# Patient Record
Sex: Female | Born: 1960 | Race: Black or African American | Hispanic: No | Marital: Single | Smoking: Current every day smoker
Health system: Southern US, Community
[De-identification: ages and names within clinical notes are randomized; demographics above are authoritative.]

## PROBLEM LIST (undated history)

## (undated) DIAGNOSIS — G8929 Other chronic pain: Secondary | ICD-10-CM

## (undated) DIAGNOSIS — M549 Dorsalgia, unspecified: Secondary | ICD-10-CM

---

## 1999-11-25 ENCOUNTER — Emergency Department (HOSPITAL_COMMUNITY): Admission: EM | Admit: 1999-11-25 | Discharge: 1999-11-26 | Payer: Self-pay | Admitting: Emergency Medicine

## 2002-05-05 ENCOUNTER — Emergency Department (HOSPITAL_COMMUNITY): Admission: EM | Admit: 2002-05-05 | Discharge: 2002-05-05 | Payer: Self-pay | Admitting: Emergency Medicine

## 2002-05-12 ENCOUNTER — Emergency Department (HOSPITAL_COMMUNITY): Admission: EM | Admit: 2002-05-12 | Discharge: 2002-05-12 | Payer: Self-pay | Admitting: Emergency Medicine

## 2006-09-23 ENCOUNTER — Emergency Department (HOSPITAL_COMMUNITY): Admission: EM | Admit: 2006-09-23 | Discharge: 2006-09-23 | Payer: Self-pay | Admitting: Emergency Medicine

## 2007-01-26 ENCOUNTER — Emergency Department (HOSPITAL_COMMUNITY): Admission: EM | Admit: 2007-01-26 | Discharge: 2007-01-26 | Payer: Self-pay | Admitting: Emergency Medicine

## 2013-01-12 ENCOUNTER — Emergency Department (HOSPITAL_COMMUNITY)
Admission: EM | Admit: 2013-01-12 | Discharge: 2013-01-12 | Disposition: A | Discharge status: Home / Self Care | Payer: Self-pay | Attending: Emergency Medicine | Admitting: Emergency Medicine

## 2013-01-12 ENCOUNTER — Encounter (HOSPITAL_COMMUNITY): Payer: Self-pay | Admitting: Emergency Medicine

## 2013-01-12 DIAGNOSIS — G8929 Other chronic pain: Secondary | ICD-10-CM | POA: Insufficient documentation

## 2013-01-12 DIAGNOSIS — M25539 Pain in unspecified wrist: Secondary | ICD-10-CM | POA: Insufficient documentation

## 2013-01-12 DIAGNOSIS — F17200 Nicotine dependence, unspecified, uncomplicated: Secondary | ICD-10-CM | POA: Insufficient documentation

## 2013-01-12 MED ORDER — MELOXICAM 15 MG PO TABS: 15.0000 mg | ORAL_TABLET | Freq: Every day | ORAL | Status: DC

## 2013-01-12 NOTE — ED Notes (Signed)
C/O "tendonitis" right forearm. States has hx of same. 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

## 2013-01-12 NOTE — Progress Notes (Signed)
222Orthopedic Tech Progress Note Patient Details:  Tamara Barajas 1960/12/22 [id number]  Ortho Devices Type of Ortho Device: Velcro wrist splint Ortho Device/Splint Location: right wrist Ortho Device/Splint Interventions: Adjustment   Nikki Dom 01/12/2013, 1:56 PM

## 2013-01-12 NOTE — ED Provider Notes (Signed)
History     CSN: [id number]  Arrival date & time 01/12/13  1252   First MD Initiated Contact with Patient 01/12/13 1311      Chief Complaint  Patient presents with  . Arm Pain    (Consider location/radiation/quality/duration/timing/severity/associated sxs/prior treatment) Patient is a 52 y.o. female presenting with arm pain.  Arm Pain This is a recurrent problem. The current episode started 1 to 4 weeks ago. The problem occurs constantly. The problem has been gradually worsening. Pertinent negatives include no abdominal pain, anorexia, arthralgias, change in bowel habit, chest pain, chills, congestion, coughing, diaphoresis, fatigue, fever, headaches, joint swelling, myalgias, nausea, neck pain, numbness, rash, sore throat, swollen glands, urinary symptoms, vertigo, visual change, vomiting or weakness. Exacerbated by: twisting the wrist. She has tried NSAIDs for the symptoms. The treatment provided no relief.   52 year old female presents with right forearm pain.  Patient has a history of the same pain in the left arm with a diagnosis of tendinitis.  The patient cleans houses for a living.  She states that she has pain in the right forearm with circular movements like window washing.  She's tried NSAIDs without relief .  Heat swelling or redness.  No known mechanism of injury.  The patient denies any fevers chills, nausea, vomiting, or paresthesia.   History reviewed. No pertinent past medical history.  History reviewed. No pertinent past surgical history.  No family history on file.  History  Substance Use Topics  . Smoking status: Current Every Day Smoker    Types: Cigarettes  . Smokeless tobacco: Not on file  . Alcohol Use: Yes     Comment: occasionally    OB History   Grav Para Term Preterm Abortions TAB SAB Ect Mult Living                  Review of Systems  Constitutional: Negative for fever, chills, diaphoresis and fatigue.  HENT: Negative for congestion, sore  throat and neck pain.   Respiratory: Negative for cough.   Cardiovascular: Negative for chest pain.  Gastrointestinal: Negative for nausea, vomiting, abdominal pain, anorexia and change in bowel habit.  Musculoskeletal: Negative for myalgias, joint swelling and arthralgias.  Skin: Negative for rash.  Neurological: Negative for vertigo, weakness, numbness and headaches.    Allergies  Review of patient's allergies indicates no known allergies.  Home Medications   Current Outpatient Rx  Name  Route  Sig  Dispense  Refill  . ibuprofen (ADVIL,MOTRIN) 200 MG tablet   Oral   Take 400 mg by mouth every 6 (six) hours as needed for pain.           BP 144/89  Pulse 75  Temp(Src) 97.4 F (36.3 C) (Oral)  SpO2 98%  LMP 10/14/2012  Physical Exam  Constitutional: She is oriented to person, place, and time. She appears well-developed and well-nourished. No distress.  HENT:  Head: Normocephalic and atraumatic.  Eyes: Conjunctivae are normal. No scleral icterus.  Neck: Normal range of motion.  Cardiovascular: Normal rate, regular rhythm and normal heart sounds.  Exam reveals no gallop and no friction rub.   No murmur heard. Pulmonary/Chest: Effort normal and breath sounds normal. No respiratory distress.  Abdominal: Soft. Bowel sounds are normal. She exhibits no distension and no mass. There is no tenderness. There is no guarding.  Musculoskeletal:  A right wrist exam was performed. SKIN: normal SWELLING: none WARMTH: no warmth TENDERNESS:  mild ROM: Full- no crepitus STRENGTH: normal NEUROVASCULAR EXAM: normal  Neurological: She is alert and oriented to person, place, and time.  Skin: Skin is warm and dry. She is not diaphoretic.    ED Course  Procedures (including critical care time)  Labs Reviewed - No data to display No results found.   1. Wrist pain, chronic, right       MDM  1:41 PM BP 144/89  Pulse 75  Temp(Src) 97.4 F (36.3 C) (Oral)  SpO2 98%  LMP  10/14/2012 Patient with probable tendinitis.  I do not suspect tenosynovitis.  No signs of developing infection such as heat redness or swelling.  Discharged with a wrist splint and anti-inflammatory.  Supportive care/return precautions  discussed.         Arthor Captain, PA-C 01/15/13 [phone]

## 2013-01-15 NOTE — ED Provider Notes (Signed)
Medical screening examination/treatment/procedure(s) were performed by non-physician practitioner and as supervising physician I was immediately available for consultation/collaboration.  Glynn Octave, MD 01/15/13 [phone]

## 2014-08-16 ENCOUNTER — Emergency Department (HOSPITAL_COMMUNITY)
Admission: EM | Admit: 2014-08-16 | Discharge: 2014-08-16 | Disposition: A | Discharge status: Home / Self Care | Payer: Self-pay | Attending: Emergency Medicine | Admitting: Emergency Medicine

## 2014-08-16 ENCOUNTER — Encounter (HOSPITAL_COMMUNITY): Payer: Self-pay | Admitting: Emergency Medicine

## 2014-08-16 DIAGNOSIS — M549 Dorsalgia, unspecified: Secondary | ICD-10-CM

## 2014-08-16 DIAGNOSIS — S39012A Strain of muscle, fascia and tendon of lower back, initial encounter: Secondary | ICD-10-CM | POA: Insufficient documentation

## 2014-08-16 DIAGNOSIS — Y9259 Other trade areas as the place of occurrence of the external cause: Secondary | ICD-10-CM | POA: Insufficient documentation

## 2014-08-16 DIAGNOSIS — Z720 Tobacco use: Secondary | ICD-10-CM | POA: Insufficient documentation

## 2014-08-16 DIAGNOSIS — X58XXXA Exposure to other specified factors, initial encounter: Secondary | ICD-10-CM | POA: Insufficient documentation

## 2014-08-16 DIAGNOSIS — S29012A Strain of muscle and tendon of back wall of thorax, initial encounter: Secondary | ICD-10-CM

## 2014-08-16 DIAGNOSIS — Y93E9 Activity, other interior property and clothing maintenance: Secondary | ICD-10-CM | POA: Insufficient documentation

## 2014-08-16 DIAGNOSIS — X503XXA Overexertion from repetitive movements, initial encounter: Secondary | ICD-10-CM

## 2014-08-16 HISTORY — DX: Other chronic pain: G89.29

## 2014-08-16 HISTORY — DX: Dorsalgia, unspecified: M54.9

## 2014-08-16 MED ORDER — HYDROCODONE-ACETAMINOPHEN 5-325 MG PO TABS
2.0000 | ORAL_TABLET | Freq: Once | ORAL | Status: AC
  Administered 2014-08-16: 2 via ORAL
  Filled 2014-08-16: qty 2

## 2014-08-16 MED ORDER — CYCLOBENZAPRINE HCL 10 MG PO TABS: 10.0000 mg | ORAL_TABLET | Freq: Two times a day (BID) | ORAL | Status: DC | PRN

## 2014-08-16 MED ORDER — IBUPROFEN 600 MG PO TABS: 600.0000 mg | ORAL_TABLET | Freq: Four times a day (QID) | ORAL | Status: DC | PRN

## 2014-08-16 NOTE — ED Provider Notes (Signed)
CSN: [id number]     Arrival date & time 08/16/14  1437 History   None    Chief Complaint  Patient presents with  . Back Pain     (Consider location/radiation/quality/duration/timing/severity/associated sxs/prior Treatment) HPI [name] is a 53 year old female with past medical history of chronic back pain who presents the ER with one day of back pain. 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 Patient states the pain is a stabbing, cramping sensation, and she states is consistent with a back pain she's had in the past. Patient states moving worsens her pain, and rest alleviates her pain. Patient states in the past she's used Aleve and ibuprofen to help her back pain which have both been effective. Patient denies any numbness, weakness, saddle anesthesia, nausea, vomiting, fever, history of cancer or IV drug use.  Past Medical History  Diagnosis Date  . Chronic back pain    History reviewed. No pertinent past surgical history. History reviewed. No pertinent family history. History  Substance Use Topics  . Smoking status: Current Every Day Smoker    Types: Cigarettes  . Smokeless tobacco: Not on file  . Alcohol Use: Yes     Comment: occasionally   OB History   Grav Para Term Preterm Abortions TAB SAB Ect Mult Living                 Review of Systems  Constitutional: Negative for fever.  Gastrointestinal: Negative for nausea and vomiting.  Musculoskeletal: Positive for arthralgias and back pain.  Neurological: Negative for weakness and numbness.      Allergies  Review of patient's allergies indicates no known allergies.  Home Medications   Prior to Admission medications   Medication Sig Start Date End Date Taking? Authorizing Provider  cyclobenzaprine (FLEXERIL) 10 MG tablet Take 1 tablet (10 mg total) by mouth 2 (two)  times daily as needed for muscle spasms. 08/16/14   Monte FantasiaJoseph W Keltin Baird, PA-C  ibuprofen (ADVIL,MOTRIN) 600 MG tablet Take 1 tablet (600 mg total) by mouth every 6 (six) hours as needed. 08/16/14   Monte FantasiaJoseph W Stephfon Bovey, PA-C   BP 123/68  Pulse 64  Temp(Src) 98.8 F (37.1 C) (Oral)  Resp 18  SpO2 100% Physical Exam  Nursing note and vitals reviewed. Constitutional: She appears well-developed and well-nourished. No distress.  HENT:  Head: Normocephalic and atraumatic.  Eyes: Conjunctivae are normal. Right eye exhibits no discharge. Left eye exhibits no discharge. No scleral icterus.  Cardiovascular:  Peripheral pulses intact at injured extremity.   Pulmonary/Chest: Effort normal. No respiratory distress.  Musculoskeletal:       Back:  Full active range of motion of back. Tenderness noted from superior aspect of left scapula all the way down to left lateral lumbar region.  Neurological: She is alert. She has normal strength. No cranial nerve deficit or sensory deficit. She displays a negative Romberg sign. Coordination and gait normal. GCS eye subscore is 4. GCS verbal subscore is 5. GCS motor subscore is 6.  Patient fully alert answering questions appropriate full, clear sentences. Motor strength 5 out of 5 in all major muscle groups of upper and lower extremities. Negative straight leg raise bilaterally. Distal sensation intact in extremities.  Skin: Skin is warm and dry. No rash noted. She is not diaphoretic.    ED Course  Procedures (including critical care time) Labs Review Labs Reviewed - No data to display  Imaging Review No results found.   EKG Interpretation None      MDM   Final diagnoses:  Acute back pain  Upper back strain, initial encounter  Repetitive strain injury of lower back, initial encounter    Patient with back pain.  No neurological deficits and normal neuro exam.  Patient can walk but states is painful.  No loss of bowel or bladder control.  No concern for  cauda equina.  No fever, night sweats, weight loss, h/o cancer, IVDU.  RICE protocol and pain medicine indicated and discussed with patient.   BP 123/68  Pulse 64  Temp(Src) 98.8 F (37.1 C) (Oral)  Resp 18  SpO2 100%  Signed,  Ladona MowJoe Briza Bark, PA-C 3:55 PM     Monte FantasiaJoseph W Cayton Cuevas, PA-C 08/17/14 [phone]

## 2014-08-16 NOTE — Progress Notes (Signed)
  CARE MANAGEMENT ED NOTE 08/16/2014  Patient:  Tamara Barajas,Tamara Barajas   Account Number:  [id number]  Date Initiated:  08/16/2014  Documentation initiated by:  Radford PaxFERRERO,Jashua Knaak  Subjective/Objective Assessment:   Patient presents to Ed with back pain     Subjective/Objective Assessment Detail:     Action/Plan:   Action/Plan Detail:   Anticipated DC Date:  08/16/2014     Status Recommendation to Physician:   Result of Recommendation:      DC Planning Services  Other  PCP issues    Choice offered to / List presented to:            Status of service:  Completed, signed off  ED Comments:   ED Comments Detail:  EDCM spoke to patient at bedside. Patient confirms she does not have a pcp or insurance [place]. EDCM [facility], informed patient of services there and walk in times.  EDCM also provided patient with list of pcps who accept self pay patients, list of discount pharmacies and websites needymeds.org and GoodRX.com for medication assistance, phone number to inquire about the orange card, phone number to inquire about Medic[employer], phone number to inquire about the Affordable Care Act, financial resources in the community such as local churches, [employer], urban ministries, and dental assistance for uninsured patients. Patient thankfulf or resources.  No further EDCM needs at this time. [facility] referral made.

## 2014-08-16 NOTE — Discharge Instructions (Signed)
Back Pain, Adult °Low back pain is very common. About 1 in 5 people have back pain. The cause of low back pain is rarely dangerous. The pain often gets better over time. About half of people with a sudden onset of back pain feel better in just 2 weeks. About 8 in 10 people feel better by 6 weeks.  °CAUSES °Some common causes of back pain include: °· Strain of the muscles or ligaments supporting the spine. °· Wear and tear (degeneration) of the spinal discs. °· Arthritis. °· Direct injury to the back. °DIAGNOSIS °Most of the time, the direct cause of low back pain is not known. However, back pain can be treated effectively even when the exact cause of the pain is unknown. Answering your caregiver's questions about your overall health and symptoms is one of the most accurate ways to make sure the cause of your pain is not dangerous. If your caregiver needs more information, he or she may order lab work or imaging tests (X-rays or MRIs). However, even if imaging tests show changes in your back, this usually does not require surgery. °HOME CARE INSTRUCTIONS °For many people, back pain returns. Since low back pain is rarely dangerous, it is often a condition that people can learn to manage on their own.  °· Remain active. It is stressful on the back to sit or stand in one place. Do not sit, drive, or stand in one place for more than 30 minutes at a time. Take short walks on level surfaces as soon as pain allows. Try to increase the length of time you walk each day. °· Do not stay in bed. Resting more than 1 or 2 days can delay your recovery. °· Do not avoid exercise or work. Your body is made to move. It is not dangerous to be active, even though your back may hurt. Your back will likely heal faster if you return to being active before your pain is gone. °· Pay attention to your body when you  bend and lift. Many people have less discomfort when lifting if they bend their knees, keep the load close to their bodies, and  avoid twisting. Often, the most comfortable positions are those that put less stress on your recovering back. °· Find a comfortable position to sleep. Use a firm mattress and lie on your side with your knees slightly bent. If you lie on your back, put a pillow under your knees. °· Only take over-the-counter or prescription medicines as directed by your caregiver. Over-the-counter medicines to reduce pain and inflammation are often the most helpful. Your caregiver may prescribe muscle relaxant drugs. These medicines help dull your pain so you can more quickly return to your normal activities and healthy exercise. °· Put ice on the injured area. °¨ Put ice in a plastic bag. °¨ Place a towel between your skin and the bag. °¨ Leave the ice on for 15-20 minutes, 03-04 times a day for the first 2 to 3 days. After that, ice and heat may be alternated to reduce pain and spasms. °· Ask your caregiver about trying back exercises and gentle massage. This may be of some benefit. °· Avoid feeling anxious or stressed. Stress increases muscle tension and can worsen back pain. It is important to recognize when you are anxious or stressed and learn ways to manage it. Exercise is a great option. °SEEK MEDICAL CARE IF: °· You have pain that is not relieved with rest or medicine. °· You have pain that does not improve in 1 week. °· You have new symptoms. °· You are generally not feeling well. °SEEK   IMMEDIATE MEDICAL CARE IF:  °· You have pain that radiates from your back into your legs. °· You develop new bowel or bladder control problems. °· You have unusual weakness or numbness in your arms or legs. °· You develop nausea or vomiting. °· You develop abdominal pain. °· You feel faint. °Document Released: 10/13/2005 Document Revised: 04/13/2012 Document Reviewed: 02/14/2014 °ExitCare® Patient Information ©2015 [employer]. This information is not intended to replace advice given to you by your health care provider. Make sure you  discuss any questions you have with your health care provider. ° ° °Back Exercises °Back exercises help treat and prevent back injuries. The goal of back exercises is to increase the strength of your abdominal and back muscles and the flexibility of your back. These exercises should be started when you no longer have back pain. Back exercises include: °· Pelvic Tilt. Lie on your back with your knees bent. Tilt your pelvis until the lower part of your back is against the floor. Hold this position 5 to 10 sec and repeat 5 to 10 times. °· Knee to Chest. Pull first 1 knee up against your chest and hold for 20 to 30 seconds, repeat this with the other knee, and then both knees. This may be done with the other leg straight or bent, whichever feels better. °· Sit-Ups or Curl-Ups. Bend your knees 90 degrees. Start with tilting your pelvis, and do a partial, slow sit-up, lifting your trunk only 30 to 45 degrees off the floor. Take at least 2 to 3 seconds for each sit-up. Do not do sit-ups with your knees out straight. If partial sit-ups are difficult, simply do the above but with only tightening your abdominal muscles and holding it as directed. °· Hip-Lift. Lie on your back with your knees flexed 90 degrees. Push down with your feet and shoulders as you raise your hips a couple inches off the floor; hold for 10 seconds, repeat 5 to 10 times. °· Back arches. Lie on your stomach, propping yourself up on bent elbows. Slowly press on your hands, causing an arch in your low back. Repeat 3 to 5 times. Any initial stiffness and discomfort should lessen with repetition over time. °· Shoulder-Lifts. Lie face down with arms beside your body. Keep hips and torso pressed to floor as you slowly lift your head and shoulders off the floor. °Do not overdo your exercises, especially in the beginning. Exercises may cause you some mild back discomfort which lasts for a few minutes; however, if the pain is more severe, or lasts for more than  15 minutes, do not continue exercises until you see your caregiver. Improvement with exercise therapy for back problems is slow.  °See your caregivers for assistance with developing a proper back exercise program. °Document Released: 11/20/2004 Document Revised: 01/05/2012 Document Reviewed: 08/14/2011 °ExitCare® Patient Information ©2015 [employer]. This information is not intended to replace advice given to you by your health care provider. Make sure you discuss any questions you have with your health care provider. ° ° °Emergency Department Resource Guide °1) Find a Doctor and Pay Out of Pocket °Although you won't have to find out who is covered by your insurance plan, it is a good idea to ask around and get recommendations. You will then need to call the office and see if the doctor you have chosen will accept you as a new patient and what types of options they offer for patients who are self-pay. Some doctors offer discounts or   will set up payment plans for their patients who do not have insurance, but you will need to ask so you aren't surprised when you get to your appointment. ° °2) Contact Your Local Health Department °Not all health departments have doctors that can see patients for sick visits, but many do, so it is worth a call to see if yours does. If you don't know where your local health department is, you can check in your phone book. The CDC also has a tool to help you locate your state's health department, and many state websites also have listings of all of their local health departments. ° °3) Find a Walk-in Clinic °If your illness is not likely to be very severe or complicated, you may want to try a walk in clinic. These are popping up all over the country in pharmacies, drugstores, and shopping centers. They're usually staffed by nurse practitioners or physician assistants that have been trained to treat common illnesses and complaints. They're usually fairly quick and inexpensive. However,  if you have serious medical issues or chronic medical problems, these are probably not your best option. ° °No Primary Care Doctor: °- Call [facility] at  832-8000 - they can help you locate a primary care doctor that  accepts your insurance, provides certain services, etc. °- Physician Referral Service- 1-800-533-3463 ° °Chronic Pain Problems: °Organization         Address  Phone   Notes  °[facility]  (336) 297-2271 Patients need to be referred by their primary care doctor.  ° °Medication Assistance: °Organization         Address  Phone   Notes  °[place] [facility] [address] °[place], [place] [address] (336) 641-8030 --Must be a resident of [place] °-- Must have NO insurance coverage whatsoever (no Medicaid/ Medicare, etc.) °-- The pt. MUST have a primary care doctor that directs their care regularly and follows them in the community °  °[facility]  (866) 331-1348   °[employer]  (888) 892-1162   ° °Agencies that provide inexpensive medical care: °Organization         Address  Phone   Notes  °[facility]  (336) 832-8035   °[facility]    (336) 832-7272   °[facility] [address] °[place], [place] [address] (336) 832-4777   °[facility] [address], °[place] (336) 271-4999   °[facility]    (336) 373-0678   °[facility]    (336) 272-1050   °[facility] ° [address], [place] Phone:  (336) 832-4444, Fax:  (336) 832-4440 Hours of Operation:  9 am - 6 pm, M-F.  Also accepts Medicaid/Medicare and self-pay.  °[facility] ° [address],  Phone: (336) 832-3150, Fax: (336) 832-3151. Hours of Operation:  8:30 am - 5:30 pm, M-F.  Also accepts Medicaid and self-pay.  °[facility] [place] [address], [place] Phone: (336) 878-6027   °[facility] [address], [place], [place] (336)723-1848, Ext. 123 Mondays & Thursdays: 7-9 AM.  First 15 patients are seen on a first come, first serve basis. °  ° °Medicaid-accepting [place] Providers: ° °Organization         Address  Phone   Notes  °[facility] [address],  (336)   641-2100 Also accepts self-pay patients.  °[facility] [address], [place] ° (336) 856-9996   °[facility] [address], [place] (336) 288-8857   °[facility] [address] [place] [address], [place] (336) 299-7000   °Veita Bland [address], [place]  ° (336) 373-1557 Only accepts  Access Medicaid patients after they have their name applied to their card.  ° °Self-Pay (no insurance) in [place]: ° °Organization         Address  Phone   Notes  °Sickle Cell Patients, [facility] [address], [place] (336) 832-1970   °[facility] [address], [place] (336) 832-4400   ° [facility] ° [address], [place] (336) 992-4800   °[facility]/Dr. Osei-Bonsu ° [address] [place] [address], [place] or [address], [place] (336) [phone] Phone number for both [place] and [place] locations is the same.  °[facility] [address], [place] (336) 299-0000   °[facility] [address] [place] [address], [place] or [address] (336) 852-7530 °(336) 878-2260   °[facility] [address], [place] (336) 350-1642, phone; (336) 294-5005, fax Sees patients 1st and 3rd Saturday of every month.  Must not qualify for public or private insurance (i.e. Medicaid, Medicare, [place] Health Choice, Veterans' Benefits) • Household income should be no more than 200% of the poverty level •The clinic cannot treat you if you are pregnant or think you are  pregnant • Sexually transmitted diseases are not treated at the clinic.  ° ° °Dental Care: °Organization         Address  Phone  Notes  °[facility] [address], [place] (336) 641-6152 Accepts children up to age 21 who are enrolled in Medicaid or [place] Health Choice; pregnant women with a Medicaid card; and children who have applied for Medicaid or [place] Health Choice, but were declined, whose parents can pay a reduced fee at time of service.  °[facility] [place]  [address], [place] (336) 641-7733 Accepts children up to age 21 who are enrolled in Medicaid or [place] Health Choice; pregnant women with a Medicaid card; and children who have applied for Medicaid or  Health Choice, but were declined, whose parents can pay a reduced fee at time of service.  °[facility] ° [address], [place] (336) 641-4533 Patients are seen by appointment only. Walk-ins are not accepted. [facility] will see patients 18 years of age and older. °Monday - Tuesday (8am-5pm) °Most Wednesdays (8:30-5pm) °$30 per visit, cash only  °[facility] ° [address], [place] (336) 641-4533 Patients are seen by appointment only. Walk-ins are not accepted. [facility] will see patients 18 years of age and older. °One Wednesday Evening (Monthly: Volunteer Based).  $30 per visit, cash only  °[facility]  (919) 537-3737 for adults; Children under age 4, call Graduate Pediatric Dentistry at (919) [phone]. Children aged 4-14, please call (919) [phone] to request a pediatric application. ° Dental services are provided in all areas of dental care including fillings, crowns and bridges, complete and partial dentures, implants, gum treatment, root canals, and extractions. Preventive care is also provided. Treatment   is provided to both adults and  children. °Patients are selected via a lottery and there is often a waiting list. °  °[facility] [address], °[place] ° (336) 763-8833 [email or web address] °  °[facility] [address], [place], [place] (336)723-1848, Ext. 123 Second and Fourth Thursday of each month, opens at 6:30 AM; Clinic ends at 9 AM.  Patients are seen on a first-come first-served basis, and a limited number are seen during each clinic.  ° °[facility] ° [address], [place], [place] (336) 723-7904   Eligibility Requirements °You must have lived in [place], [place], or [place] counties for at least the last three months. °  You cannot be eligible for state or federal sponsored healthcare insurance, including Veterans Administration, Medicaid, or Medicare. °  You generally cannot be eligible for healthcare insurance through your employer.  °  How to apply: °Eligibility screenings are held every Tuesday and Wednesday afternoon from 1:00 pm until 4:00 pm. You do not need an appointment for the interview!  °[facility] [address], [place], [place] 336-631-2330   °[facility] [facility]  336-342-8273   °[facility]  336-703-3100   °[facility]  336-570-6415   ° °Behavioral Health Resources in the Community: °Intensive Outpatient Programs °Organization         Address  Phone  Notes  °[facility] [address], [place], [place] 336-878-6098   °[facility] [address], [place], [place] 336-832-9800   °[facility] [address], [place], [place] ° 336-882-2125   °[facility] [address],  °[place], [place] 1-800-853-5163 or 336-641-4981   °Substance Abuse Resources °Organization         Address  Phone  Notes  °[facility]  336-882-2125   °[facility]  336-784-9470   °[facility]  336-285-9073    °[facility]  336-845-3988   °Residential & Outpatient Substance Abuse Program  1-800-659-3381   °Psychological Services °Organization         Address  Phone  Notes  °[facility]  336- 832-9600   °[facility]  336- 378-7881   °[facility] [address], [place] 1-800-853-5163 or 336-641-4981   ° °Mobile Crisis Teams °Organization         Address  Phone  Notes  °[facility], Mobile Crisis Care Unit  1-877-626-1772   °Assertive °[facility] ° [address] [place], [place] 336-834-9664   °Sharon DeEsch [address] °[place] 336-554-5454   ° °Self-Help/Support Groups °Organization         Address  Phone             Notes  °[facility] - variety of support groups  336- 373-1402 Call for more information  °Narcotics Anonymous (NA), [facility] [address], °[place]  2 meetings at this location  ° °Residential Treatment Programs °Organization         Address  Phone  Notes  °[facility] [address],    °[place]  1-866-801-8205   °[facility] ° [address], [place], [place] 704-293-8524   °[facility] [address], [place] 336-845-3988 Admissions: 8am-3pm M-F  °[facility] [address]    °[place],   [place] 336-841-1104   °[facility] [address], [place], [place] 336-379-7146   °[facility] [address]  °[place], [place] 336-285-9073   °[facility] - Intensive Outpatient [address], [place], [place] 336-852-3033   °[facility] [address]  °[place], [place] 1-877-615-2722 or 336-784-9470   °[facility] [address] [place], [place] 336-227-7417 Accepts Medicaid  °[facility] [address]  ° [place] 1-800-659-3381 Substance Abuse/Addiction Treatment  ° °[place]  Behavioral Health Resources °Organization         Address  Phone  Notes  °[facility]  (888) 581-9988   °Julie Brannon, PhD [address] [place], [place]   (336) 349-5553 or (336) 951-0000   °[facility]   [address] °[place], [place] (336) 349-4454   °[facility] [address], [place], [place] (336) 342-8316 Insurance/Medicaid/sponsorship through [facility]  °[facility] [address]                                    [place], [place] (336) 342-8316 Therapy/tele-psych/case  °[facility] [address]  ° [place], [place] (336) 349-2233    °Dr. Arfeen  (336) 349-4544   °[facility] [place]  [employer] [place] [facility] 1) [address], [place] °2) [address], [place] °3)  [address], [place] (336) 349-3220 °(336) 342-7768 ° °(336) 342-8140   °[place] [facility] (336) 342-1394 or (336) 342-3537 (After Hours)    ° ° ° °

## 2014-08-16 NOTE — ED Notes (Signed)
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  Was making a bed when she had sudden, shooting back pain.  Eased herself to floor.

## 2014-08-19 NOTE — ED Provider Notes (Signed)
Medical screening examination/treatment/procedure(s) were performed by non-physician practitioner and as supervising physician I was immediately available for consultation/collaboration.   EKG Interpretation None       Seleena Reimers R. Alanmichael Barmore, MD 08/19/14 0906 

## 2014-08-21 NOTE — ED Notes (Signed)
Pt's chart accessed after pt called asking for a work note. 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 Explained to pt that we can not print work note saying that because that was not in her discharge instructions. Pt voiced understanding.

## 2014-08-25 ENCOUNTER — Ambulatory Visit: Payer: Self-pay | Admitting: Internal Medicine

## 2014-09-13 ENCOUNTER — Ambulatory Visit: Payer: Self-pay

## 2015-07-09 ENCOUNTER — Emergency Department (HOSPITAL_COMMUNITY)
Admission: EM | Admit: 2015-07-09 | Discharge: 2015-07-09 | Disposition: A | Discharge status: Home / Self Care | Payer: No Typology Code available for payment source | Attending: Physician Assistant | Admitting: Physician Assistant

## 2015-07-09 ENCOUNTER — Encounter (HOSPITAL_COMMUNITY): Payer: Self-pay | Admitting: Emergency Medicine

## 2015-07-09 ENCOUNTER — Emergency Department (HOSPITAL_COMMUNITY): Payer: No Typology Code available for payment source

## 2015-07-09 DIAGNOSIS — M1811 Unilateral primary osteoarthritis of first carpometacarpal joint, right hand: Secondary | ICD-10-CM | POA: Insufficient documentation

## 2015-07-09 DIAGNOSIS — M79644 Pain in right finger(s): Secondary | ICD-10-CM | POA: Insufficient documentation

## 2015-07-09 DIAGNOSIS — Z720 Tobacco use: Secondary | ICD-10-CM | POA: Insufficient documentation

## 2015-07-09 DIAGNOSIS — G8929 Other chronic pain: Secondary | ICD-10-CM | POA: Diagnosis not present

## 2015-07-09 MED ORDER — HYDROCODONE-ACETAMINOPHEN 5-325 MG PO TABS
1.0000 | ORAL_TABLET | Freq: Once | ORAL | Status: AC
  Administered 2015-07-09: 1 via ORAL
  Filled 2015-07-09: qty 1

## 2015-07-09 MED ORDER — HYDROCODONE-ACETAMINOPHEN 5-325 MG PO TABS: 1.0000 | ORAL_TABLET | Freq: Four times a day (QID) | ORAL | Status: DC | PRN

## 2015-07-09 MED ORDER — NAPROXEN 500 MG PO TABS: 500.0000 mg | ORAL_TABLET | Freq: Two times a day (BID) | ORAL | Status: DC | PRN

## 2015-07-09 NOTE — ED Provider Notes (Signed)
CSN: [id number]     Arrival date & time 07/09/15  1247 History  This chart was scribed for non-physician practitioner Allen Derry, PA-C working with Abelino Derrick, MD by Lyndel Safe, ED Scribe. This patient was seen in room TR07C/TR07C and the patient's care was started at 1:43 PM.   Chief Complaint  Patient presents with  . Finger Injury    right thumb   Patient is a 54 y.o. female presenting with hand pain. The history is provided by the patient. No language interpreter was used.  Hand Pain This is a new problem. The current episode started more than 2 days ago. The problem occurs constantly. The problem has not changed since onset.Pertinent negatives include no chest pain, no abdominal pain and no shortness of breath. The symptoms are aggravated by bending (thumb bending). The symptoms are relieved by NSAIDs. Treatments tried: NSAIDs. The treatment provided mild relief.   HPI Comments: Tamara Barajas is a 54 y.o. female with a PMHx of chronic back pain, who presents to the Emergency Department complaining of constant, 8/10, throbbing/aching, non-radiating right thumb pain onset 1 week, exacerbated with movement of thumb, and mildly relieved by Motrin. Associated swelling to PIP joint. Pt is a housekeeper, right-handed, but denies know injury or overuse activities. Pt denies trauma/fall/injury to area, redness/warmth to affected area, fevers, chills, nausea, vomiting, diarrhea, constipation, abdominal pain, CP, SOB, myalgias, hematuria, dysuria, numbness, tingling or weakness. NKDA. No hx of gout.   Past Medical History  Diagnosis Date  . Chronic back pain    No past surgical history on file. No family history on file. Social History  Substance Use Topics  . Smoking status: Current Every Day Smoker    Types: Cigarettes  . Smokeless tobacco: None  . Alcohol Use: Yes     Comment: occasionally   OB History    No data available     Review of Systems   Constitutional: Negative for fever and chills.  Respiratory: Negative for shortness of breath.   Cardiovascular: Negative for chest pain.  Gastrointestinal: Negative for nausea, vomiting, abdominal pain, diarrhea and constipation.  Genitourinary: Negative for dysuria and hematuria.  Musculoskeletal: Positive for joint swelling (R thumb) and arthralgias (R thumb). Negative for myalgias.  Skin: Negative for color change.  Allergic/Immunologic: Negative for immunocompromised state.  Neurological: Negative for weakness and numbness.   A complete 10 system review of systems was obtained and is otherwise negative except at noted in the HPI and PMH.  Allergies  Review of patient's allergies indicates no known allergies.  Home Medications   Prior to Admission medications   Medication Sig Start Date End Date Taking? Authorizing Provider  cyclobenzaprine (FLEXERIL) 10 MG tablet Take 1 tablet (10 mg total) by mouth 2 (two) times daily as needed for muscle spasms. 08/16/14   Ladona Mow, PA-C  ibuprofen (ADVIL,MOTRIN) 600 MG tablet Take 1 tablet (600 mg total) by mouth every 6 (six) hours as needed. 08/16/14   Ladona Mow, PA-C   BP 122/75 mmHg  Pulse 60  Temp(Src) 97.7 F (36.5 C) (Oral)  Resp 17  Ht  (1.702 m)  Wt 185 lb (83.915 kg)  BMI 28.97 kg/m2  SpO2 100% Physical Exam  Constitutional: She is oriented to person, place, and time. Vital signs are normal. She appears well-developed and well-nourished.  Non-toxic appearance. No distress.  Afebrile, nontoxic, NAD  HENT:  Head: Normocephalic and atraumatic.  Mouth/Throat: Mucous membranes are normal.  Eyes: Conjunctivae and EOM are normal. Right  eye exhibits no discharge. Left eye exhibits no discharge.  Neck: Normal range of motion. Neck supple.  Cardiovascular: Normal rate and intact distal pulses.   Pulmonary/Chest: Effort normal. No respiratory distress.  Abdominal: Normal appearance. She exhibits no distension.  Musculoskeletal:  She exhibits tenderness.       Right hand: She exhibits decreased range of motion ( first PIP joint due to pain ), tenderness, bony tenderness and swelling. She exhibits normal two-point discrimination, normal capillary refill and no deformity. Normal sensation noted. Normal strength noted.       Hands: Right hand with mild TTP to the first PIP joint, with limited ROM in this joint due to pain, FROM at MCP and CMC joints, mild swelling noted to the radial aspect of PIP joint consistent with osteophyte, no erythema or warmth. Strength and sensation grossly intact, distal pulses intact, cap refill brisk and present, all other digits freely mobile. No wrist TTP.   Neurological: She is alert and oriented to person, place, and time. She has normal strength. No sensory deficit.  Skin: Skin is warm, dry and intact. No rash noted.  Psychiatric: She has a normal mood and affect. Her behavior is normal.  Nursing note and vitals reviewed.   ED Course  Procedures  DIAGNOSTIC STUDIES: Oxygen Saturation is 100% on RA, normal by my interpretation.    COORDINATION OF CARE: 1:48 PM Discussed treatment plan which includes to order Xray of right thumb with pt. Pt acknowledges and agrees to plan.   Labs Review Labs Reviewed - No data to display  Imaging Review Dg Finger Thumb Right  07/09/2015   CLINICAL DATA:  No known injury. Swelling and pain of the distal interphalangeal joint.  EXAM: RIGHT THUMB 2+V  COMPARISON:  None.  FINDINGS: Well corticated ossific densities are identified adjacent to the interphalangeal joint. There is significant soft tissue swelling of the thumb centered at the interphalangeal joint. No radiopaque foreign body or soft tissue gas. No acute fracture or subluxation. There is degenerative change at the first carpometacarpal joint.  IMPRESSION: 1. Soft tissue swelling at the interphalangeal joint. 2.  No evidence for acute osseous abnormality.   Electronically Signed   By: Norva Pavlov M.D.   On: 07/09/2015 14:15   I have personally reviewed and evaluated these images and lab results as part of my medical decision-making.   EKG Interpretation None      MDM   Final diagnoses:  Thumb pain, right  Degenerative arthritis of thumb, right    54 y.o. female here with atraumatic R thumb pain/swelling. NVI. Mild tenderness to PIP joint, appears to have osteophyte type lesion. Doubt gout or septic joint, no erythma or warmth. ROM limited due to pain at PIP joint. 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 Will give pain meds and reassess shortly.   2:35 PM Xray reveals osteophyte and arthritic changes. Will place in thumb spica splint for 1 wk to allow area to rest, then PRN after. Discussed heat/RICE therapy. Will give pain meds for home. Discussed f/up with hand specialist in 1wk for ongoing management. I explained the diagnosis and have given explicit precautions to return to the ER including for any other new or worsening symptoms. The patient understands and accepts the medical plan as it's been dictated and I have answered their questions. Discharge instructions concerning home care and prescriptions have been given. The patient  is STABLE and is discharged to home in good condition.   I personally performed the services described in this documentation, which was scribed in my presence. The recorded information has been reviewed and is accurate.   BP 122/75 mmHg  Pulse 60  Temp(Src) 97.7 F (36.5 C) (Oral)  Resp 17  Ht 5\' 7"  (1.702 m)  Wt 185 lb (83.915 kg)  BMI 28.97 kg/m2  SpO2 100%  Meds ordered this encounter  Medications  . HYDROcodone-acetaminophen (NORCO/VICODIN) 5-325 MG per tablet 1 tablet    Sig:   . naproxen (NAPROSYN) 500 MG tablet    Sig: Take 1 tablet (500 mg total) by mouth 2 (two) times daily as needed for mild pain, moderate pain or headache (TAKE WITH  MEALS.).    Dispense:  20 tablet    Refill:  0    Order Specific Question:  Supervising Provider    Answer:  MILLER, BRIAN [[id number]]  . HYDROcodone-acetaminophen (NORCO) 5-325 MG per tablet    Sig: Take 1 tablet by mouth every 6 (six) hours as needed for severe pain.    Dispense:  6 tablet    Refill:  0    Order Specific Question:  Supervising Provider    Answer:  Eber Hong [[id number]]      Samara Stankowski Camprubi-Soms, PA-C 07/09/15 1436  Courteney Randall An, MD 07/09/15 1546

## 2015-07-09 NOTE — Discharge Instructions (Signed)
Wear thumb splint for 1 week to allow the thumb to rest, then as needed for comfort after that. Ice or use heat alternating, and elevate thumb throughout the day. Alternate between naprosyn and norco for pain relief. Do not drive or operate machinery with pain medication use. Call hand specialist follow up today or tomorrow to schedule followup appointment for recheck of ongoing thumb pain in 1 week. Return to the ER for changes or worsening symptoms.    Osteoarthritis Osteoarthritis is a disease that causes soreness and inflammation of a joint. It occurs when the cartilage at the affected joint wears down. Cartilage acts as a cushion, covering the ends of bones where they meet to form a joint. Osteoarthritis is the most common form of arthritis. It often occurs in older people. The joints affected most often by this condition include those in the:  Ends of the fingers.  Thumbs.  Neck.  Lower back.  Knees.  Hips. CAUSES  Over time, the cartilage that covers the ends of bones begins to wear away. This causes bone to rub on bone, producing pain and stiffness in the affected joints.  RISK FACTORS Certain factors can increase your chances of having osteoarthritis, including:  Older age.  Excessive body weight.  Overuse of joints.  Previous joint injury. SIGNS AND SYMPTOMS   Pain, swelling, and stiffness in the joint.  Over time, the joint may lose its normal shape.  Small deposits of bone (osteophytes) may grow on the edges of the joint.  Bits of bone or cartilage can break off and float inside the joint space. This may cause more pain and damage. DIAGNOSIS  Your health care provider will do a physical exam and ask about your symptoms. Various tests may be ordered, such as:  X-rays of the affected joint.  An MRI scan.  Blood tests to rule out other types of arthritis.  Joint fluid tests. This involves using a needle to draw fluid from the joint and examining the fluid under  a microscope. TREATMENT  Goals of treatment are to control pain and improve joint function. Treatment plans may include:  A prescribed exercise program that allows for rest and joint relief.  A weight control plan.  Pain relief techniques, such as:  Properly applied heat and cold.  Electric pulses delivered to nerve endings under the skin (transcutaneous electrical nerve stimulation [TENS]).  Massage.  Certain nutritional supplements.  Medicines to control pain, such as:  Acetaminophen.  Nonsteroidal anti-inflammatory drugs (NSAIDs), such as naproxen.  Narcotic or central-acting agents, such as tramadol.  Corticosteroids. These can be given orally or as an injection.  Surgery to reposition the bones and relieve pain (osteotomy) or to remove loose pieces of bone and cartilage. Joint replacement may be needed in advanced states of osteoarthritis. HOME CARE INSTRUCTIONS   Take medicines only as directed by your health care provider.  Maintain a healthy weight. Follow your health care provider's instructions for weight control. This may include dietary instructions.  Exercise as directed. Your health care provider can recommend specific types of exercise. These may include:  Strengthening exercises. These are done to strengthen the muscles that support joints affected by arthritis. They can be performed with weights or with exercise bands to add resistance.  Aerobic activities. These are exercises, such as brisk walking or low-impact aerobics, that get your heart pumping.  Range-of-motion activities. These keep your joints limber.  Balance and agility exercises. These help you maintain daily living skills.  Rest your  affected joints as directed by your health care provider.  Keep all follow-up visits as directed by your health care provider. SEEK MEDICAL CARE IF:   Your skin turns red.  You develop a rash in addition to your joint pain.  You have worsening joint  pain.  You have a fever along with joint or muscle aches. SEEK IMMEDIATE MEDICAL CARE IF:  You have a significant loss of weight or appetite.  You have night sweats. FOR MORE INFORMATION   National Institute of Arthritis and Musculoskeletal and Skin Diseases: www.niams.http://www.myers.net/  [employer] on Aging: https://walker.com/  American College of Rheumatology: www.rheumatology.org Document Released: 10/13/2005 Document Revised: 02/27/2014 Document Reviewed: 06/20/2013 [facility] Patient Information 2015 [place], [place]. This information is not intended to replace advice given to you by your health care provider. Make sure you discuss any questions you have with your health care provider.  Heat Therapy Heat therapy can help ease sore, stiff, injured, and tight muscles and joints. Heat relaxes your muscles, which may help ease your pain.  RISKS AND COMPLICATIONS If you have any of the following conditions, do not use heat therapy unless your health care provider has approved:  Poor circulation.  Healing wounds or scarred skin in the area being treated.  Diabetes, heart disease, or high blood pressure.  Not being able to feel (numbness) the area being treated.  Unusual swelling of the area being treated.  Active infections.  Blood clots.  Cancer.  Inability to communicate pain. This may include young children and people who have problems with their brain function (dementia).  Pregnancy. Heat therapy should only be used on old, pre-existing, or long-lasting (chronic) injuries. Do not use heat therapy on new injuries unless directed by your health care provider. HOW TO USE HEAT THERAPY There are several different kinds of heat therapy, including:  Moist heat pack.  Warm water bath.  Hot water bottle.  Electric heating pad.  Heated gel pack.  Heated wrap.  Electric heating pad. Use the heat therapy method suggested by your health care provider. Follow your health care  provider's instructions on when and how to use heat therapy. GENERAL HEAT THERAPY RECOMMENDATIONS  Do not sleep while using heat therapy. Only use heat therapy while you are awake.  Your skin may turn pink while using heat therapy. Do not use heat therapy if your skin turns red.  Do not use heat therapy if you have new pain.  High heat or long exposure to heat can cause burns. Be careful when using heat therapy to avoid burning your skin.  Do not use heat therapy on areas of your skin that are already irritated, such as with a rash or sunburn. SEEK MEDICAL CARE IF:  You have blisters, redness, swelling, or numbness.  You have new pain.  Your pain is worse. MAKE SURE YOU:  Understand these instructions.  Will watch your condition.  Will get help right away if you are not doing well or get worse. Document Released: 01/05/2012 Document Revised: 02/27/2014 Document Reviewed: 12/06/2013 [facility] Patient Information 2015 [place], [place]. This information is not intended to replace advice given to you by your health care provider. Make sure you discuss any questions you have with your health care provider.  Cryotherapy Cryotherapy means treatment with cold. Ice or gel packs can be used to reduce both pain and swelling. Ice is the most helpful within the first 24 to 48 hours after an injury or flare-up from overusing a muscle or joint. Sprains, strains,  spasms, burning pain, shooting pain, and aches can all be eased with ice. Ice can also be used when recovering from surgery. Ice is effective, has very few side effects, and is safe for most people to use. PRECAUTIONS  Ice is not a safe treatment option for people with:  Raynaud phenomenon. This is a condition affecting small blood vessels in the extremities. Exposure to cold may cause your problems to return.  Cold hypersensitivity. There are many forms of cold hypersensitivity, including:  Cold urticaria. Red, itchy hives appear on the  skin when the tissues begin to warm after being iced.  Cold erythema. This is a red, itchy rash caused by exposure to cold.  Cold hemoglobinuria. Red blood cells break down when the tissues begin to warm after being iced. The hemoglobin that carry oxygen are passed into the urine because they cannot combine with blood proteins fast enough.  Numbness or altered sensitivity in the area being iced. If you have any of the following conditions, do not use ice until you have discussed cryotherapy with your caregiver:  Heart conditions, such as arrhythmia, angina, or chronic heart disease.  High blood pressure.  Healing wounds or open skin in the area being iced.  Current infections.  Rheumatoid arthritis.  Poor circulation.  Diabetes. Ice slows the blood flow in the region it is applied. This is beneficial when trying to stop inflamed tissues from spreading irritating chemicals to surrounding tissues. However, if you expose your skin to cold temperatures for too long or without the proper protection, you can damage your skin or nerves. Watch for signs of skin damage due to cold. HOME CARE INSTRUCTIONS Follow these tips to use ice and cold packs safely.  Place a dry or damp towel between the ice and skin. A damp towel will cool the skin more quickly, so you may need to shorten the time that the ice is used.  For a more rapid response, add gentle compression to the ice.  Ice for no more than 10 to 20 minutes at a time. The bonier the area you are icing, the less time it will take to get the benefits of ice.  Check your skin after 5 minutes to make sure there are no signs of a poor response to cold or skin damage.  Rest 20 minutes or more between uses.  Once your skin is numb, you can end your treatment. You can test numbness by very lightly touching your skin. The touch should be so light that you do not see the skin dimple from the pressure of your fingertip. When using ice, most people  will feel these normal sensations in this order: cold, burning, aching, and numbness.  Do not use ice on someone who cannot communicate their responses to pain, such as small children or people with dementia. HOW TO MAKE AN ICE PACK Ice packs are the most common way to use ice therapy. Other methods include ice massage, ice baths, and cryosprays. Muscle creams that cause a cold, tingly feeling do not offer the same benefits that ice offers and should not be used as a substitute unless recommended by your caregiver. To make an ice pack, do one of the following:  Place crushed ice or a bag of frozen vegetables in a sealable plastic bag. Squeeze out the excess air. Place this bag inside another plastic bag. Slide the bag into a pillowcase or place a damp towel between your skin and the bag.  Mix 3 parts water  with 1 part rubbing alcohol. Freeze the mixture in a sealable plastic bag. When you remove the mixture from the freezer, it will be slushy. Squeeze out the excess air. Place this bag inside another plastic bag. Slide the bag into a pillowcase or place a damp towel between your skin and the bag. SEEK MEDICAL CARE IF:  You develop white spots on your skin. This may give the skin a blotchy (mottled) appearance.  Your skin turns blue or pale.  Your skin becomes waxy or hard.  Your swelling gets worse. MAKE SURE YOU:   Understand these instructions.  Will watch your condition.  Will get help right away if you are not doing well or get worse. Document Released: 06/09/2011 Document Revised: 02/27/2014 Document Reviewed: 06/09/2011 [facility] Patient Information 2015 [place], [place]. This information is not intended to replace advice given to you by your health care provider. Make sure you discuss any questions you have with your health care provider.

## 2015-07-09 NOTE — ED Notes (Signed)
Patient coming from home with c/o of right thumb pain since last week.  Denies injury.  Patient states the bone at the bend of the thumb is more pronounced.  Pain is 8/10.  No medications.

## 2015-07-09 NOTE — ED Notes (Signed)
PA Mercedes at bedside.  

## 2017-12-03 ENCOUNTER — Encounter (HOSPITAL_COMMUNITY): Payer: Self-pay | Admitting: Emergency Medicine

## 2017-12-03 ENCOUNTER — Emergency Department (HOSPITAL_COMMUNITY)
Admission: EM | Admit: 2017-12-03 | Discharge: 2017-12-03 | Disposition: A | Discharge status: Home / Self Care | Payer: BLUE CROSS/BLUE SHIELD | Attending: Emergency Medicine | Admitting: Emergency Medicine

## 2017-12-03 DIAGNOSIS — X100XXA Contact with hot drinks, initial encounter: Secondary | ICD-10-CM | POA: Insufficient documentation

## 2017-12-03 DIAGNOSIS — T2112XA Burn of first degree of abdominal wall, initial encounter: Secondary | ICD-10-CM | POA: Diagnosis present

## 2017-12-03 DIAGNOSIS — Y999 Unspecified external cause status: Secondary | ICD-10-CM | POA: Diagnosis not present

## 2017-12-03 DIAGNOSIS — Y92010 Kitchen of single-family (private) house as the place of occurrence of the external cause: Secondary | ICD-10-CM | POA: Insufficient documentation

## 2017-12-03 DIAGNOSIS — Y93G3 Activity, cooking and baking: Secondary | ICD-10-CM | POA: Insufficient documentation

## 2017-12-03 DIAGNOSIS — T310 Burns involving less than 10% of body surface: Secondary | ICD-10-CM | POA: Insufficient documentation

## 2017-12-03 NOTE — ED Triage Notes (Signed)
Pt to ED with burn to abdomen - patient states on Friday, she was cooking chicken and hot water popped up, burning through her t-shirt. Patient states she has put neosporin on it and has kept it covered since, but reports it now looks worse since yesterday. Bandage saturated with yellowish fluid, some bleeding noted to site as well. EDP assessed patient in triage and new bandage applied. Patient denies fevers/chills but states there was a good amount of pus-like d/c yesterday and today.

## 2017-12-03 NOTE — ED Provider Notes (Signed)
[facility] Provider Note   CSN: [id number] Arrival date & time: 12/03/17  1021     History   Chief Complaint Chief Complaint  Patient presents with  . Burn    HPI Tamara Barajas is a 57 y.o. female who presents to the emergency department with a burn to the abdominal wall that occurred 6 days ago after she spilled boiling water on the area.  She reports constant pain that is worse with bending over and alleviated by nothing.  After the burn, she cleaned the area with a washcloth and cool water.  She has applied Neosporin and Shea butter to the area to help avoid scarring.  No other treatment prior to arrival.  She has not immunocompromise.  The history is provided by the patient. No language interpreter was used.  Burn  The incident occurred more than 2 days ago. The burns occurred in the kitchen. The burns occurred while cooking. The burns were a result of contact with a hot liquid. The burns are located on the abdomen. The burns appear red. The pain is moderate. She has tried salve, ice and running the burn under water for the symptoms. The treatment provided mild relief.    Past Medical History:  Diagnosis Date  . Chronic back pain     There are no active problems to display for this patient.   History reviewed. No pertinent surgical history.  OB History    No data available       Home Medications    Prior to Admission medications   Medication Sig Start Date End Date Taking? Authorizing Provider  cyclobenzaprine (FLEXERIL) 10 MG tablet Take 1 tablet (10 mg total) by mouth 2 (two) times daily as needed for muscle spasms. 08/16/14   Ladona MowMintz, Joe, PA-C  HYDROcodone-acetaminophen (NORCO) 5-325 MG per tablet Take 1 tablet by mouth every 6 (six) hours as needed for severe pain. 07/09/15   Street, LillyMercedes, PA-C  ibuprofen (ADVIL,MOTRIN) 600 MG tablet Take 1 tablet (600 mg total) by mouth every 6 (six) hours as needed. 08/16/14   Ladona MowMintz, Joe,  PA-C  naproxen (NAPROSYN) 500 MG tablet Take 1 tablet (500 mg total) by mouth 2 (two) times daily as needed for mild pain, moderate pain or headache (TAKE WITH MEALS.). 07/09/15   Street, [place], PA-C    Family History No family history on file.  Social History Social History   Tobacco Use  . Smoking status: Current Every Day Smoker    Packs/day: 0.50    Types: Cigarettes  Substance Use Topics  . Alcohol use: Yes    Comment: occasionally  . Drug use: Yes    Types: Marijuana     Allergies   Patient has no known allergies.   Review of Systems Review of Systems  Constitutional: Negative for activity change.  Respiratory: Negative for shortness of breath.   Cardiovascular: Negative for chest pain.  Gastrointestinal: Negative for abdominal pain, nausea and vomiting.  Musculoskeletal: Negative for back pain.  Skin: Positive for wound (burn). Negative for rash.  Neurological: Negative for numbness.     Physical Exam Updated Vital Signs BP 138/81 (BP Location: Right Arm)   Pulse (!) 54   Temp 98.3 F (36.8 C) (Oral)   Resp 16   Ht 5\' 8"  (1.727 m)   Wt 72.6 kg (160 lb)   LMP 10/14/2012   SpO2 100%   BMI 24.33 kg/m   Physical Exam  Constitutional: No distress.  HENT:  Head: Normocephalic.  Eyes: Conjunctivae are normal.  Neck: Neck supple.  Cardiovascular: Normal rate and regular rhythm. Exam reveals no gallop and no friction rub.  No murmur heard. Pulmonary/Chest: Effort normal. No respiratory distress.  Abdominal: Soft. She exhibits no distension and no mass. There is tenderness. There is no rebound and no guarding. No hernia.  Superficial first-degree burn noted to the right abdominal wall, lateral to the periumbilical area.  No blistering.  Granulation tissue is present.  No purulent drainage is noted.  No surrounding erythema, edema, warmth, or ecchymosis.  He is minimally tender to palpation to the area immediately surrounding the burn.  The burn is  approximately 2 cm x 7 cm in a horizontal distribution.  Neurological: She is alert.  Skin: Skin is warm. No rash noted.  Psychiatric: Her behavior is normal.  Nursing note and vitals reviewed.    ED Treatments / Results  Labs (all labs ordered are listed, but only abnormal results are displayed) Labs Reviewed - No data to display  EKG  EKG Interpretation None       Radiology No results found.  Procedures Procedures (including critical care time)  Medications Ordered in ED Medications - No data to display   Initial Impression / Assessment and Plan / ED Course  I have reviewed the triage vital signs and the nursing notes.  Pertinent labs & imaging results that were available during my care of the patient were reviewed by me and considered in my medical decision making (see chart for details).     57 year old female presenting with a superficial first-degree burn to the abdominal wall.  On physical exam, no blistering or purulent drainage is noted.  No signs of surrounding infection.  She has not immunocompromise.  The area was cleaned with sterile saline and gently debrided with gauze.  Bacitracin was applied followed by a nonadherent dressing.  Home care instructions were provided.  All questions were answered.  Strict return precautions were given, including signs and symptoms of infection.  She is hemodynamically stable and in no acute distress.  Patient safe for discharge home at this time.  Final Clinical Impressions(s) / ED Diagnoses   Final diagnoses:  Superficial burn of abdominal wall, initial encounter    ED Discharge Orders    None       Barkley Boards, PA-C 12/03/17 1246    Pricilla Loveless, MD 12/04/17 2213

## 2017-12-03 NOTE — Discharge Instructions (Signed)
Keep the area clean with warm water and soap at least once daily.  Pat the area dry then apply a topical antibiotic such as bacitracin or Neosporin at least once to twice daily.  If the skin of the area gets dry between these applications, you can apply Vaseline or petroleum jelly to the area.  Please avoid applying other lotions such as Shea butter because these have other ingredients that can set the burn up for infection.  Take 600 mg of ibuprofen with food every 8 hours or thousand milligrams of Tylenol every 8 hours for pain control.  You can also apply ice to the area for 15-20 minutes as frequently as needed to help with pain control.  If you develop any new or worsening symptoms including fever, chills, or if the skin around to the burn gets red, hot, or swollen, please return to the emergency department for reevaluation.  Call the number on your discharge paperwork to get established with a primary care provider.

## 2018-05-11 ENCOUNTER — Emergency Department (HOSPITAL_COMMUNITY)
Admission: EM | Admit: 2018-05-11 | Discharge: 2018-05-11 | Disposition: A | Discharge status: Home / Self Care | Payer: Self-pay | Attending: Emergency Medicine | Admitting: Emergency Medicine

## 2018-05-11 ENCOUNTER — Encounter (HOSPITAL_COMMUNITY): Payer: Self-pay

## 2018-05-11 ENCOUNTER — Other Ambulatory Visit: Payer: Self-pay

## 2018-05-11 ENCOUNTER — Emergency Department (HOSPITAL_COMMUNITY): Payer: No Typology Code available for payment source

## 2018-05-11 DIAGNOSIS — M545 Low back pain, unspecified: Secondary | ICD-10-CM

## 2018-05-11 DIAGNOSIS — R55 Syncope and collapse: Secondary | ICD-10-CM

## 2018-05-11 DIAGNOSIS — Z79899 Other long term (current) drug therapy: Secondary | ICD-10-CM | POA: Insufficient documentation

## 2018-05-11 DIAGNOSIS — G8929 Other chronic pain: Secondary | ICD-10-CM | POA: Insufficient documentation

## 2018-05-11 DIAGNOSIS — F17210 Nicotine dependence, cigarettes, uncomplicated: Secondary | ICD-10-CM | POA: Insufficient documentation

## 2018-05-11 LAB — BASIC METABOLIC PANEL
Anion gap: 8 (ref 5–15)
BUN: 10 mg/dL (ref 6–20)
CALCIUM: 9.5 mg/dL (ref 8.9–10.3)
CHLORIDE: 108 mmol/L (ref 98–111)
CO2: 26 mmol/L (ref 22–32)
CREATININE: 0.68 mg/dL (ref 0.44–1.00)
Glucose, Bld: 102 mg/dL — ABNORMAL HIGH (ref 70–99)
Potassium: 4.3 mmol/L (ref 3.5–5.1)
SODIUM: 142 mmol/L (ref 135–145)

## 2018-05-11 LAB — CBC WITH DIFFERENTIAL/PLATELET
BASOS PCT: 0 %
Basophils Absolute: 0 10*3/uL (ref 0.0–0.1)
EOS ABS: 0.1 10*3/uL (ref 0.0–0.7)
Eosinophils Relative: 1 %
HCT: 41.6 % (ref 36.0–46.0)
Hemoglobin: 13 g/dL (ref 12.0–15.0)
LYMPHS ABS: 2.3 10*3/uL (ref 0.7–4.0)
Lymphocytes Relative: 25 %
MCH: 26.4 pg (ref 26.0–34.0)
MCHC: 31.3 g/dL (ref 30.0–36.0)
MCV: 84.6 fL (ref 78.0–100.0)
Monocytes Absolute: 0.5 10*3/uL (ref 0.1–1.0)
Monocytes Relative: 5 %
NEUTROS PCT: 69 %
Neutro Abs: 6.2 10*3/uL (ref 1.7–7.7)
PLATELETS: 285 10*3/uL (ref 150–400)
RBC: 4.92 MIL/uL (ref 3.87–5.11)
RDW: 15.6 % — ABNORMAL HIGH (ref 11.5–15.5)
WBC: 9.1 10*3/uL (ref 4.0–10.5)

## 2018-05-11 LAB — I-STAT BETA HCG BLOOD, ED (MC, WL, AP ONLY)

## 2018-05-11 LAB — I-STAT TROPONIN, ED: Troponin i, poc: 0 ng/mL (ref 0.00–0.08)

## 2018-05-11 MED ORDER — KETOROLAC TROMETHAMINE 30 MG/ML IJ SOLN
30.0000 mg | Freq: Once | INTRAMUSCULAR | Status: AC
  Administered 2018-05-11: 30 mg via INTRAVENOUS
  Filled 2018-05-11: qty 1

## 2018-05-11 MED ORDER — SODIUM CHLORIDE 0.9 % IV BOLUS
1000.0000 mL | Freq: Once | INTRAVENOUS | Status: AC
  Administered 2018-05-11: 1000 mL via INTRAVENOUS

## 2018-05-11 NOTE — ED Provider Notes (Signed)
[facility]-EMERGENCY DEPT Provider Note   CSN: [id number] Arrival date & time: 05/11/18  0848     History   Chief Complaint Chief Complaint  Patient presents with  . Back Pain    HPI Tamara Barajas is a 57 y.o. female.  She presents today with a complaint that yesterday she had 2 syncopal episodes.  She states she was outside in the heat talking to her friend when she next remembers being on the ground and he was shaking her that she had passed out.  Her right flank and low back has hurt since then.  Moderate severity increased with turning twisting.  She tried some Advil with mild relief.  She then had another syncopal event about an hour later.  Since then she is remained feeling well.  She has no prior history of syncope no recent med changes.  Syncope was not associated with any chest pain or shortness of breath abdominal pain vomiting diarrhea incontinence of urine or any urinary symptoms.  There is no headache.  She denies any numbness or weakness.  The history is provided by the patient.  Loss of Consciousness   This is a new problem. The current episode started yesterday. She lost consciousness for a period of less than one minute. The problem is associated with normal activity. Associated symptoms include back pain. Pertinent negatives include abdominal pain, bowel incontinence, chest pain, confusion, dizziness, fever, headaches, nausea, visual change and vomiting. She has tried bed rest for the symptoms. The treatment provided mild relief.    Past Medical History:  Diagnosis Date  . Chronic back pain     There are no active problems to display for this patient.   History reviewed. No pertinent surgical history.   OB History   None      Home Medications    Prior to Admission medications   Medication Sig Start Date End Date Taking? Authorizing Provider  ibuprofen (ADVIL,MOTRIN) 200 MG tablet Take 200 mg by mouth every 6 (six) hours as needed for  moderate pain.   Yes [provider]  cyclobenzaprine (FLEXERIL) 10 MG tablet Take 1 tablet (10 mg total) by mouth 2 (two) times daily as needed for muscle spasms. Patient not taking: Reported on 05/11/2018 08/16/14   Ladona MowMintz, Joe, PA-C  HYDROcodone-acetaminophen [facility](NORCO) 5-325 MG per tablet Take 1 tablet by mouth every 6 (six) hours as needed for severe pain. Patient not taking: Reported on 05/11/2018 07/09/15   Street, [place], PA-C  ibuprofen (ADVIL,MOTRIN) 600 MG tablet Take 1 tablet (600 mg total) by mouth every 6 (six) hours as needed. Patient not taking: Reported on 05/11/2018 08/16/14   Ladona MowMintz, Joe, PA-C  naproxen (NAPROSYN) 500 MG tablet Take 1 tablet (500 mg total) by mouth 2 (two) times daily as needed for mild pain, moderate pain or headache (TAKE WITH MEALS.). Patient not taking: Reported on 05/11/2018 07/09/15   Street, [place], PA-C    Family History Family History  Problem Relation Age of Onset  . Hypertension Mother   . Diverticulitis Mother   . Cancer Father     Social History Social History   Tobacco Use  . Smoking status: Current Every Day Smoker    Packs/day: 0.50    Types: Cigarettes  . Smokeless tobacco: Never Used  Substance Use Topics  . Alcohol use: Yes    Comment: occasionally  . Drug use: Yes    Types: Marijuana     Allergies   Patient has no known allergies.  Review of Systems Review of Systems  Constitutional: Negative for fever.  HENT: Negative for sore throat.   Eyes: Negative for visual disturbance.  Respiratory: Negative for shortness of breath.   Cardiovascular: Positive for syncope. Negative for chest pain.  Gastrointestinal: Negative for abdominal pain, bowel incontinence, nausea and vomiting.  Genitourinary: Negative for dysuria.  Musculoskeletal: Positive for back pain. Negative for neck pain.  Skin: Negative for rash.  Neurological: Positive for syncope. Negative for dizziness and headaches.  Psychiatric/Behavioral: Negative  for confusion.     Physical Exam Updated Vital Signs BP (!) 158/84 (BP Location: Left Arm)   Pulse 60   Temp (!) 97.4 F (36.3 C) (Oral)   Resp 20   Ht 5\' 7"  (1.702 m)   Wt 70.3 kg (155 lb)   LMP 10/14/2012   SpO2 100%   BMI 24.28 kg/m   Physical Exam  Constitutional: She appears well-developed and well-nourished.  HENT:  Head: Normocephalic and atraumatic.  Eyes: Conjunctivae are normal.  Neck: Neck supple.  Cardiovascular: Normal rate, regular rhythm, normal heart sounds and intact distal pulses.  Pulmonary/Chest: Effort normal. No stridor. No respiratory distress. She has no wheezes.  Abdominal: Soft. She exhibits no mass. There is no tenderness. There is no guarding.  Musculoskeletal: Normal range of motion. She exhibits no tenderness or deformity.       Cervical back: Normal.       Thoracic back: Normal.       Back:  Neurological: She is alert. She has normal strength. No cranial nerve deficit or sensory deficit. Gait normal. GCS eye subscore is 4. GCS verbal subscore is 5. GCS motor subscore is 6.  Skin: Skin is warm and dry.  Psychiatric: She has a normal mood and affect.     ED Treatments / Results  Labs (all labs ordered are listed, but only abnormal results are displayed) Labs Reviewed  BASIC METABOLIC PANEL - Abnormal; Notable for the following components:      Result Value   Glucose, Bld 102 (*)    All other components within normal limits  CBC WITH DIFFERENTIAL/PLATELET - Abnormal; Notable for the following components:   RDW 15.6 (*)    All other components within normal limits  I-STAT BETA HCG BLOOD, ED ([facility], [facility], [facility] ONLY)  I-STAT TROPONIN, ED    EKG EKG Interpretation  Date/Time:  Tuesday May 11 2018 16:50:12 EDT Ventricular Rate:  56 PR Interval:    QRS Duration: 77 QT Interval:  434 QTC Calculation: 419 R Axis:   49 Text Interpretation:  Sinus rhythm Consider left atrial enlargement Minimal ST elevation, inferior leads no prior to compare  with Confirmed by Meridee Score [id number]) on 05/11/2018 4:55:50 PM   Radiology Dg Chest 2 View  Result Date: 05/11/2018 CLINICAL DATA:  Syncope. EXAM: CHEST - 2 VIEW COMPARISON:  None. FINDINGS: The cardiac silhouette is upper limits of normal in size. There is minimal opacity in the left lung base. The right lung is clear. No pleural effusion or pneumothorax is identified. No acute osseous abnormality is identified. IMPRESSION: Minimal left basilar opacity, likely atelectasis. Electronically Signed   By: Sebastian Ache M.D.   On: 05/11/2018 16:44   Dg Lumbar Spine Complete  Result Date: 05/11/2018 CLINICAL DATA:  Right low back pain starting yesterday EXAM: LUMBAR SPINE - COMPLETE 4+ VIEW COMPARISON:  None. FINDINGS: Degenerative facet arthropathy bilaterally at L4-5 and L5-S1 with mild spurring of both sacroiliac joints. No malalignment. Preserved intervertebral disc spaces. Mild spurring  anterior to the vertebral body column multiple lumbar levels, most notably L3-4. no fracture or subluxation. IMPRESSION: 1. Mild lower lumbar degenerative facet arthropathy and mild spurring anterior to the vertebral body column, with preserved articular spaces and no subluxation or fracture observed. Electronically Signed   By: Gaylyn Rong M.D.   On: 05/11/2018 16:50    Procedures Procedures (including critical care time)  Medications Ordered in ED Medications  sodium chloride 0.9 % bolus 1,000 mL (has no administration in time range)  ketorolac (TORADOL) 30 MG/ML injection 30 mg (has no administration in time range)     Initial Impression / Assessment and Plan / ED Course  I have reviewed the triage vital signs and the nursing notes.  Pertinent labs & imaging results that were available during my care of the patient were reviewed by me and considered in my medical decision making (see chart for details).  Clinical Course as of May 12 1422  Tue May 11, 2018  1643 Well-appearing 57 year old  female here with 2 episodes of syncope and some right lower back pain that seems very muscular.  The back pain  occurred after a syncopal event when she fell to the ground and is increased with any turning or twisting.  She is never had a syncopal event before so I think this warrants lab work x-rays  An EKG.   [MB]  1742 Patient's lab work EKG chest x-ray lumbar spine x-ray fairly unremarkable.  She had good pain relief with the Toradol so I told her to keep up with ibuprofen or Aleve and get set up with a primary care doctor so they can do further testing.  She understands to return if any worsening symptoms.   [MB]    Clinical Course User Index [MB] Terrilee Files, MD      Final Clinical Impressions(s) / ED Diagnoses   Final diagnoses:  Syncope, unspecified syncope type  Acute right-sided low back pain without sciatica    ED Discharge Orders    None       Terrilee Files, MD 05/12/18 1423

## 2018-05-11 NOTE — ED Notes (Signed)
Pt states that her pain is getting a lot worse.

## 2018-05-11 NOTE — Discharge Instructions (Addendum)
Your evaluated in the emergency department for 2 fainting episodes along with some right-sided low back pain.  You had x-rays EKG and lab work that did not show an obvious cause of your symptoms.  You should continue to take anti-inflammatories like ibuprofen or Aleve for your back pain and use cold compress as needed.  Please follow-up with primary care doctor for further evaluation and return if any worsening symptoms.

## 2018-05-11 NOTE — ED Triage Notes (Addendum)
Patient c/o right lower back pain since yesterday. Patient denies any radiation of pain. Patient states she was told she passed out yesterday and then her back started hurting.

## 2018-06-23 ENCOUNTER — Other Ambulatory Visit: Payer: Self-pay

## 2018-06-23 ENCOUNTER — Ambulatory Visit: Payer: Self-pay | Attending: Family Medicine | Admitting: Family Medicine

## 2018-06-23 ENCOUNTER — Encounter: Payer: Self-pay | Admitting: Family Medicine

## 2018-06-23 VITALS — BP 121/74 | HR 63 | Temp 98.2°F | Resp 18 | Ht 66.0 in | Wt 165.4 lb

## 2018-06-23 DIAGNOSIS — T671XXA Heat syncope, initial encounter: Secondary | ICD-10-CM

## 2018-06-23 DIAGNOSIS — M79671 Pain in right foot: Secondary | ICD-10-CM

## 2018-06-23 DIAGNOSIS — T671XXD Heat syncope, subsequent encounter: Secondary | ICD-10-CM

## 2018-06-23 DIAGNOSIS — X58XXXD Exposure to other specified factors, subsequent encounter: Secondary | ICD-10-CM | POA: Insufficient documentation

## 2018-06-23 DIAGNOSIS — G8929 Other chronic pain: Secondary | ICD-10-CM

## 2018-06-23 LAB — POCT GLYCOSYLATED HEMOGLOBIN (HGB A1C)
HbA1c POC (<> result, manual entry): 5.6 %
HbA1c, POC (controlled diabetic range): 5.6 % (ref 0.0–7.0)
HbA1c, POC (prediabetic range): 5.6 % — AB (ref 5.7–6.4)
Hemoglobin A1C: 5.6 % (ref 4.0–5.6)

## 2018-06-23 NOTE — Progress Notes (Signed)
Subjective:    Patient ID: Tamara Barajas, female    DOB: 08-Jul-1961, 57 y.o.   MRN: [id number]  HPI       57 yo female who is seen status post emergency department visit on 05/11/2018 due to syncopal episodes x2.  Patient reports that on 05/10/2018 she had been walking around her neighborhood with a friend.  Patient reports that it was very hot outside.  Patient states that about 7 PM, she went back inside and got a bottle of water for her friend but then when she walked back outside and was talking with her friend, she had onset of not feeling well and stumbling.  Patient states that she then recalled her friend shaking her arm until her to wake up.  Patient states that she was lying on the ground at that time.  Patient also thinks that she may have tripped over a tire that have been on the ground as patient also had right-sided low back pain upon awakening.  Patient states that in addition to the low back pain, she also had several episodes of throwing up but then felt better afterwards.  Patient states that she and her friend decided to walk to a local shopping plaza so that she could buy some naproxen for her back pain.  Patient states that they then walked back and they were sitting on the patient's front porch and patient states that she again became aware of her friend shaking her arm.  Patient states that she was still sitting upright but her friend told her that she was sitting there with her eyes open and slightly shaking but that she was not coherent.  Patient states that the next day she went to the emergency department for further evaluation.  Patient thinks that her syncopal episodes were related to the heat and possible dehydration.  Patient also admits to having used marijuana earlier that day as well as regular use of tobacco/smoking.      Patient reports that today's visit that her back pain has resolved and she has had no further syncopal episodes.  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  Patient states that she has pain with standing and patient sometimes says that she has to walk on her tiptoes to avoid putting pressure on her heel secondary to the pain.  Pain can be sharp at times and as high as a 6 or 7 on a 0-to-10 scale.  Patient reports that she did try changing shoes which helped for just a little bit but then the pain returned.  Patient states that her mother thinks that she may have a heel spur.  Patient also reports family history of maternal grandmother with diabetes requiring insulin, maternal grandfather with diabetes, mom with hypertension and patient states that her dad passed away from liver cancer due to hepatitis C and her dad was also a substance abuser.  Patient states that she has not been checked for hepatitis C.  Patient declines hepatitis C and liver function test at today's visit.  Patient denies any past surgical history.  Patient denies any past medical issues other than her recent syncopal episodes.  Patient states that she has had no recent medical care other than the emergency department visit due to her lack of insurance.  Review of Systems  Constitutional: Positive for fatigue (Has improved after recent emergency department visit, patient is drinking water daily). Negative for chills and fever.  HENT: Negative for sore throat and trouble swallowing.   Eyes: Negative for photophobia and visual disturbance.  Respiratory: Negative for cough and shortness of breath.   Cardiovascular: Negative for chest pain, palpitations and leg swelling.  Gastrointestinal: Negative for abdominal pain, nausea and vomiting.  Endocrine: Negative for polydipsia, polyphagia and polyuria.  Genitourinary: Negative for dysuria and frequency.  Musculoskeletal: Negative for back pain and gait problem.  Neurological: Negative for dizziness and headaches.  Psychiatric/Behavioral: Negative for suicidal ideas. The  patient is nervous/anxious.        Objective:   Physical Exam BP 121/74 (BP Location: Right Arm, Patient Position: Sitting, Cuff Size: Normal)   Pulse 63   Temp 98.2 F (36.8 C) (Oral)   Resp 18   Ht 5\' 6"  (1.676 m)   Wt 165 lb 6.4 oz (75 kg)   LMP 10/14/2012   SpO2 100%   BMI 26.70 kg/m  Vital signs reviewed General-well-nourished, well-developed older female in no acute distress.  Patient does seem slightly anxious at times. ENT-TMs gray, nares with mild edema of the nasal mucosa, normal oropharynx. Neck- patient with slight anterior neck fullness but no palpable thyromegaly or nodules, no carotid bruit Cardiovascular-regular rate and rhythm. Lungs-clear to auscultation bilaterally. Abdomen-soft, nontender Back-no CVA tenderness, no reproducible mid to low back pain Extremities-no edema. Musculoskeletal-patient with pain with palpation of plantar right mid heel Neuro-cranial nerves II through XII are grossly intact Psychologic- patient seems slightly nervous/anxious at times otherwise normal mood and judgment; upon questioning, patient admits some anxiety as she states that she has not had a doctor's visit in a long time and is afraid of what might be found    Assessment & Plan:  1. Heat syncope, subsequent encounter Patient's emergency department visit notes and labs from 05/11/2018 were reviewed.  Patient had normal labs with the exception of glucose of 102 and patient states that she was fasting.  Patient syncopal episodes most likely were related to heat exhaustion.  Patient is encouraged to remain well-hydrated.  Patient will have A1c at today's visit secondary to elevated fasting glucose of 102 and family history of diabetes.  Patient's hemoglobin A1c was 5.6.  This was within normal but due to patient's family history, would suggest repeat fasting glucose and A1c in 6 to 12 months as well as low carbohydrate diet and regular exercise. - HgB A1c  2. Chronic pain of right  heel Patient with complaint of more than 6 months of pain in the right heel.  Patient will be referred to podiatry but in the meantime, patient is encouraged to use over-the-counter heel cups or shoe inserts that she may obtain at her local pharmacy or medical supply store to see if this will help with her heel pain.  Patient may take over-the-counter ibuprofen or naproxen as needed for heel pain.  Patient should eat prior to taking this medication to help avoid stomach upset - Ambulatory referral to Podiatry  -Influenza immunization was offered at today's visit which patient declined -Patient was encouraged to schedule for annual well examination.  I also recommended the patient have liver function test and hepatitis C testing secondary to her father's history of hepatitis C and subsequent development of liver cancer.  An After Visit Summary was printed and given to the patient.  Return if symptoms worsen or fail to improve, for schedule well female exam at your convenience.

## 2018-06-23 NOTE — Patient Instructions (Signed)
Heel Spur A heel spur is a bony growth that forms on the bottom of your heel bone (calcaneus). Heel spurs are common and do not always cause pain. However, heel spurs often cause inflammation in the strong band of tissue that runs underneath the bone of your foot (plantar fascia). When this happens, you may feel pain on the bottom of your foot, near your heel. What are the causes? The cause of heel spurs is not completely understood. They may be caused by pressure on the heel. Or, they may stem from the muscle attachments (tendons) near the spur pulling on the heel. What increases the risk? You may be at risk for a heel spur if you:  Are older than 40.  Are overweight.  Have wear and tear arthritis (osteoarthritis).  Have plantar fascia inflammation.  What are the signs or symptoms? Some people have heel spurs but no symptoms. If you do have symptoms, they may include:  Pain in the bottom of your heel.  Pain that is worse when you first get out of bed.  Pain that gets worse after walking or standing.  How is this diagnosed? Your health care provider may diagnose a heel spur based on your symptoms and a physical exam. You may also have an X-ray of your foot to check for a bony growth coming from the calcaneus. How is this treated? Treatment aims to relieve the pain from the heel spur. This may include:  Stretching exercises.  Losing weight.  Wearing specific shoes, inserts, or orthotics for comfort and support.  Wearing splints at night to properly position your feet.  Taking over-the-counter medicine to relieve pain.  Being treated with high-intensity sound waves to break up the heel spur (extracorporeal shock wave therapy).  Getting steroid injections in your heel to reduce swelling and ease pain.  Having surgery if your heel spur causes long-term (chronic) pain.  Follow these instructions at home:  Take medicines only as directed by your health care provider.  Ask  your health care provider if you should use ice or cold packs on the painful areas of your heel or foot.  Avoid activities that cause you pain until you recover or as directed by your health care provider.  Stretch before exercising or being physically active.  Wear supportive shoes that fit well as directed by your health care provider. You might need to buy new shoes. Wearing old shoes or shoes that do not fit correctly may not provide the support that you need.  Lose weight if your health care provider thinks you should. This can relieve pressure on your foot that may be causing pain and discomfort. Contact a health care provider if:  Your pain continues or gets worse. This information is not intended to replace advice given to you by your health care provider. Make sure you discuss any questions you have with your health care provider. Document Released: 11/19/2005 Document Revised: 03/20/2016 Document Reviewed: 12/14/2013 Elsevier Interactive Patient Education  2018 [employer].  [employer] Exhaustion Information WHAT IS HEAT EXHAUSTION? Heat exhaustion happens when your body gets overheated from hot weather or from exercise. Heat exhaustion can lead to heat stroke, a life-threatening condition that requires emergency care. Heat exhaustion is more likely to develop when:  You are exercising or being active.  You are in hot or humid weather.  You are in bright sunshine.  You are not drinking enough water.  WHO IS AT RISK FOR THIS CONDITION? This condition is more likely  to develop in:  People who exercise in hot or humid weather.  People who exercise beyond their fitness level.  People who wear clothing that does not allow sweat to evaporate.  People who are dehydrated.  People who drink a lot of alcoholic beverages or beverages that have caffeine. This can lead to dehydration.  People who are age 56 or older.  Children.  People who have a medical condition such as heart  disease, poor circulation, sickle cell disease, or high blood pressure.  People who have a fever.  People who are very overweight (obese).  WHAT ARE THE SYMPTOMS OF THIS CONDITION? Symptoms of heat exhaustion include:  Heavy sweating along with feeling weak, dizzy, light-headed, and nauseous.  Rapid heartbeat.  Headache.  Urine that is darker than normal.  Muscle cramps, such as in the leg or side (flank).  Moist, cool, and clammy skin.  Fatigue.  Thirst.  Confusion.  Fainting.  WHAT SHOULD I DO IF I THINK I HAVE THIS CONDITION? If you think that you have heat exhaustion, call your health care provider. Follow his or her instructions. You should also:  Call a friend or a family member and ask him or her to stay with you.  Move to a cooler location, such as: ? Into the shade. ? In front of a fan. ? An air-conditioned space.  Lie down and rest.  Slowly drink nonalcoholic, caffeine-free fluids.  Take off tight clothing or extra clothing.  Take a cool bath or shower, if possible. If you do not have access to a bath or shower, dab or mist cool water on your skin.  WHY IS IT IMPORTANT TO TREAT THIS CONDITION? It is important to take care of yourself and treat heat exhaustion as soon as possible. Untreated heat exhaustion can turn into heat stroke, which is a life-threatening condition that requires urgent medical treatment. HOW CAN I PREVENT THIS CONDITION? To prevent this condition:  Drink enough fluid to keep your urine clear or pale yellow. This helps your body to sweat properly.  Avoid outdoor activities on very hot or humid days.  Do not exercise or do other physical activity when you are not feeling well.  Take breaks often during physical activity.  Wear light-colored, loose-fitting, and lightweight clothing when it is hot outside.  Wear a hat and use sunscreen when exercising outdoors.  Avoid being outside during the hottest times of the day.  Check  with your health care provider before you start any new activity, especially if you take medicine or have a medical condition.  Start any new activity slowly and work up to your fitness level.  HOW CAN I HELP TO PROTECT ELDERLY RELATIVES AND NEIGHBORS FROM THIS CONDITION? People who are age 44 or older are at greater risk for heat exhaustion. Their bodies have a harder time adjusting to heat. They are also more likely to have a medical condition or be on medicines that increase their risk for heat exhaustion. They may get heat exhaustion indoors if the heat is high for several days. You can help to protect them during hot weather by:  Checking on them two or more times each day.  Making sure that they are drinking plenty of cool, nonalcoholic, and caffeine-free fluids.  Making sure that they use their air conditioner.  Taking them to a location where air conditioning is available.  Talking with their health care provider about their medical needs, medicines, and fluid requirements.  SEEK MEDICAL CARE IF:  Your symptoms last longer than 30 minutes.  SEEK IMMEDIATE MEDICAL CARE IF:  You have any symptoms of heat stroke. These include: ? Fever. ? Vomiting. ? Red skin. ? Inability to sweat, resulting in hot, dry skin. ? Excessive thirst. ? Rapid breathing. ? Headache. ? Confusion or disorientation. ? Fainting. ? Seizures. These symptoms may represent a serious problem that is an emergency. Do not wait to see if the symptoms will go away. Get medical help right away. Call your local emergency services (911 in the [place] Do not drive yourself to the hospital. This information is not intended to replace advice given to you by your health care provider. Make sure you discuss any questions you have with your health care provider. Document Released: 07/22/2008 Document Revised: 05/02/2016 Document Reviewed: 02/03/2016 Elsevier Interactive Patient Education  [employer]2018 Elsevier Inc.

## 2018-06-30 ENCOUNTER — Ambulatory Visit: Payer: Self-pay | Attending: Family Medicine

## 2018-11-21 IMAGING — CR DG CHEST 2V
2 series · 2 of 2 positions shown · non-contrast
Comparison: None.

CLINICAL DATA: Syncope.

EXAM:
CHEST - 2 VIEW

[w chest lat]
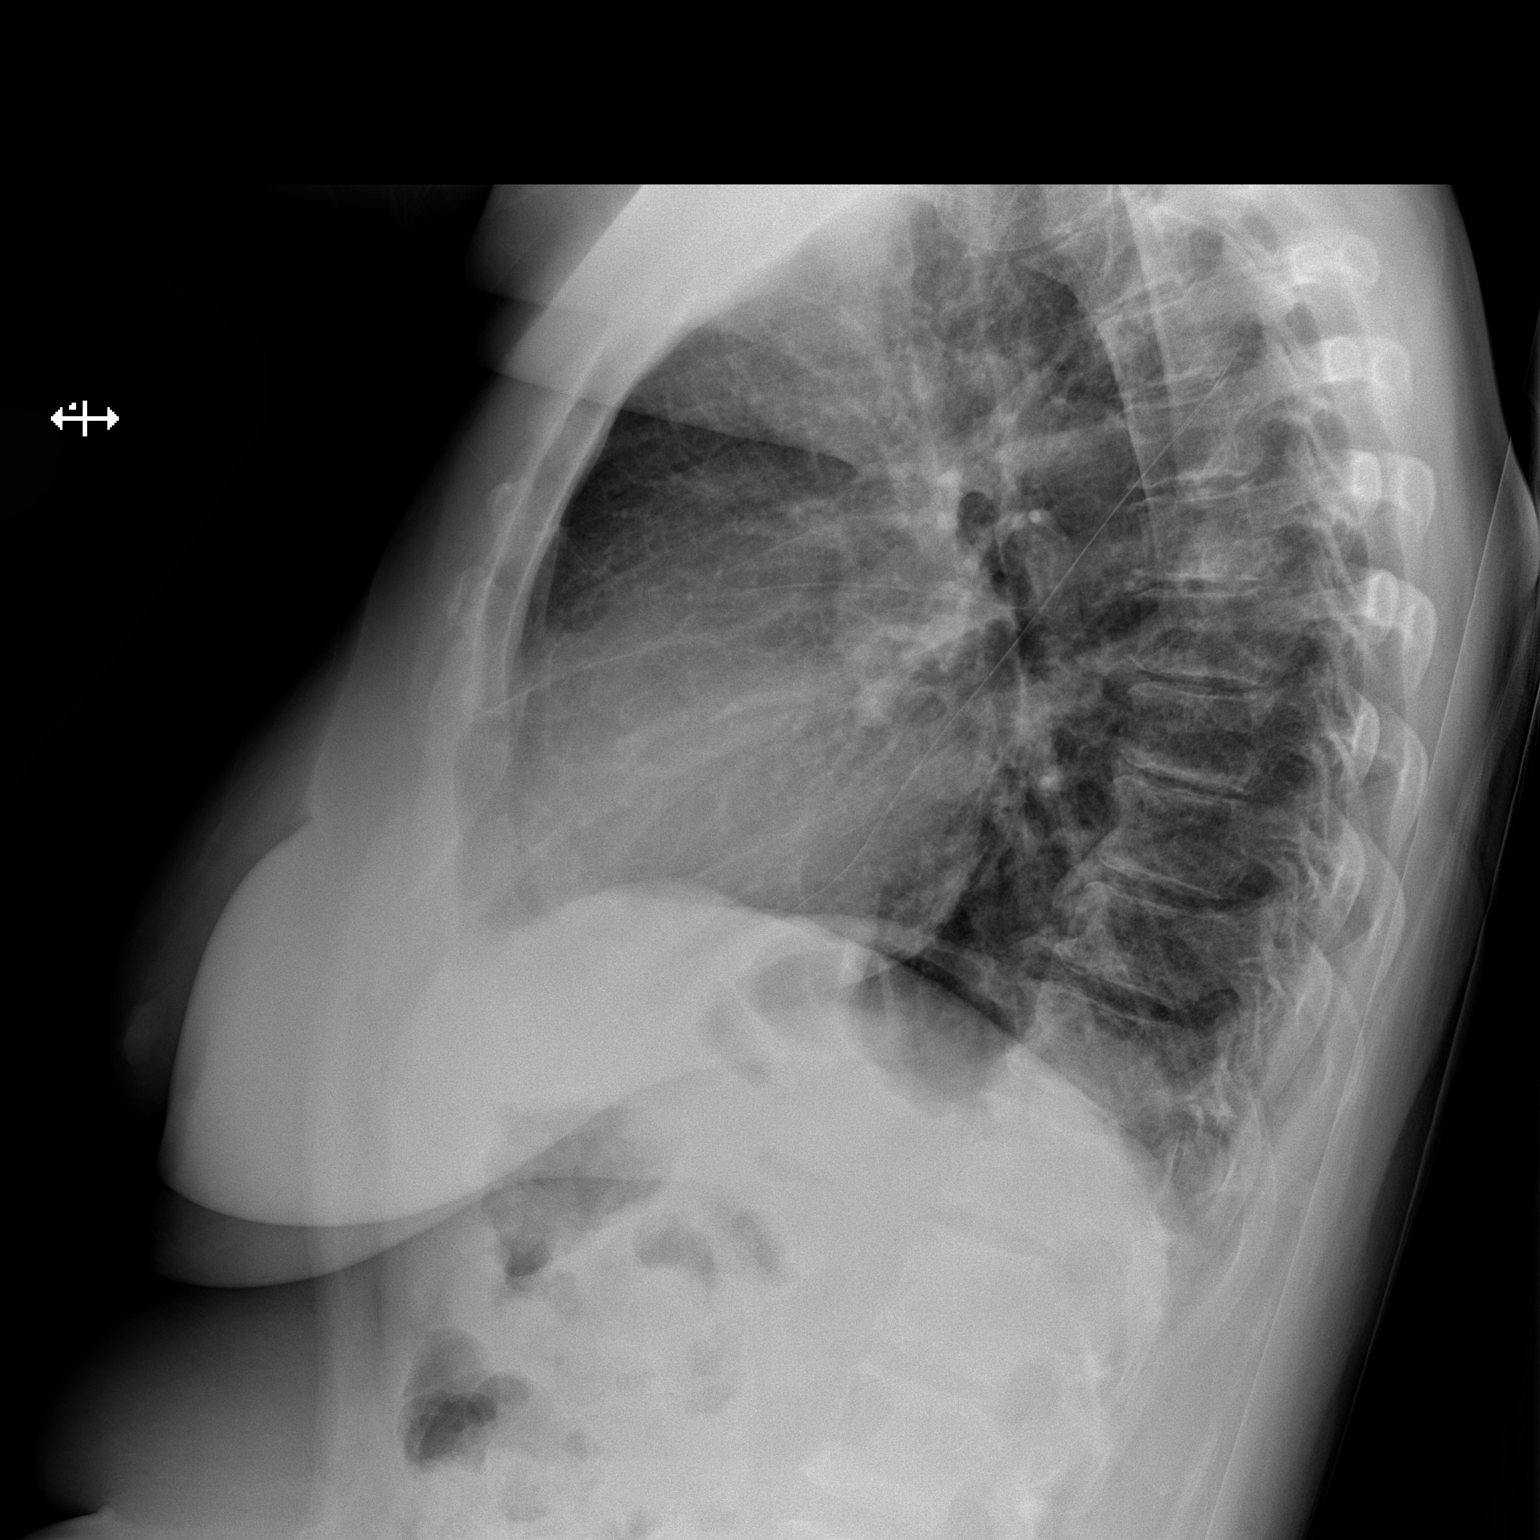

[x chest ap]
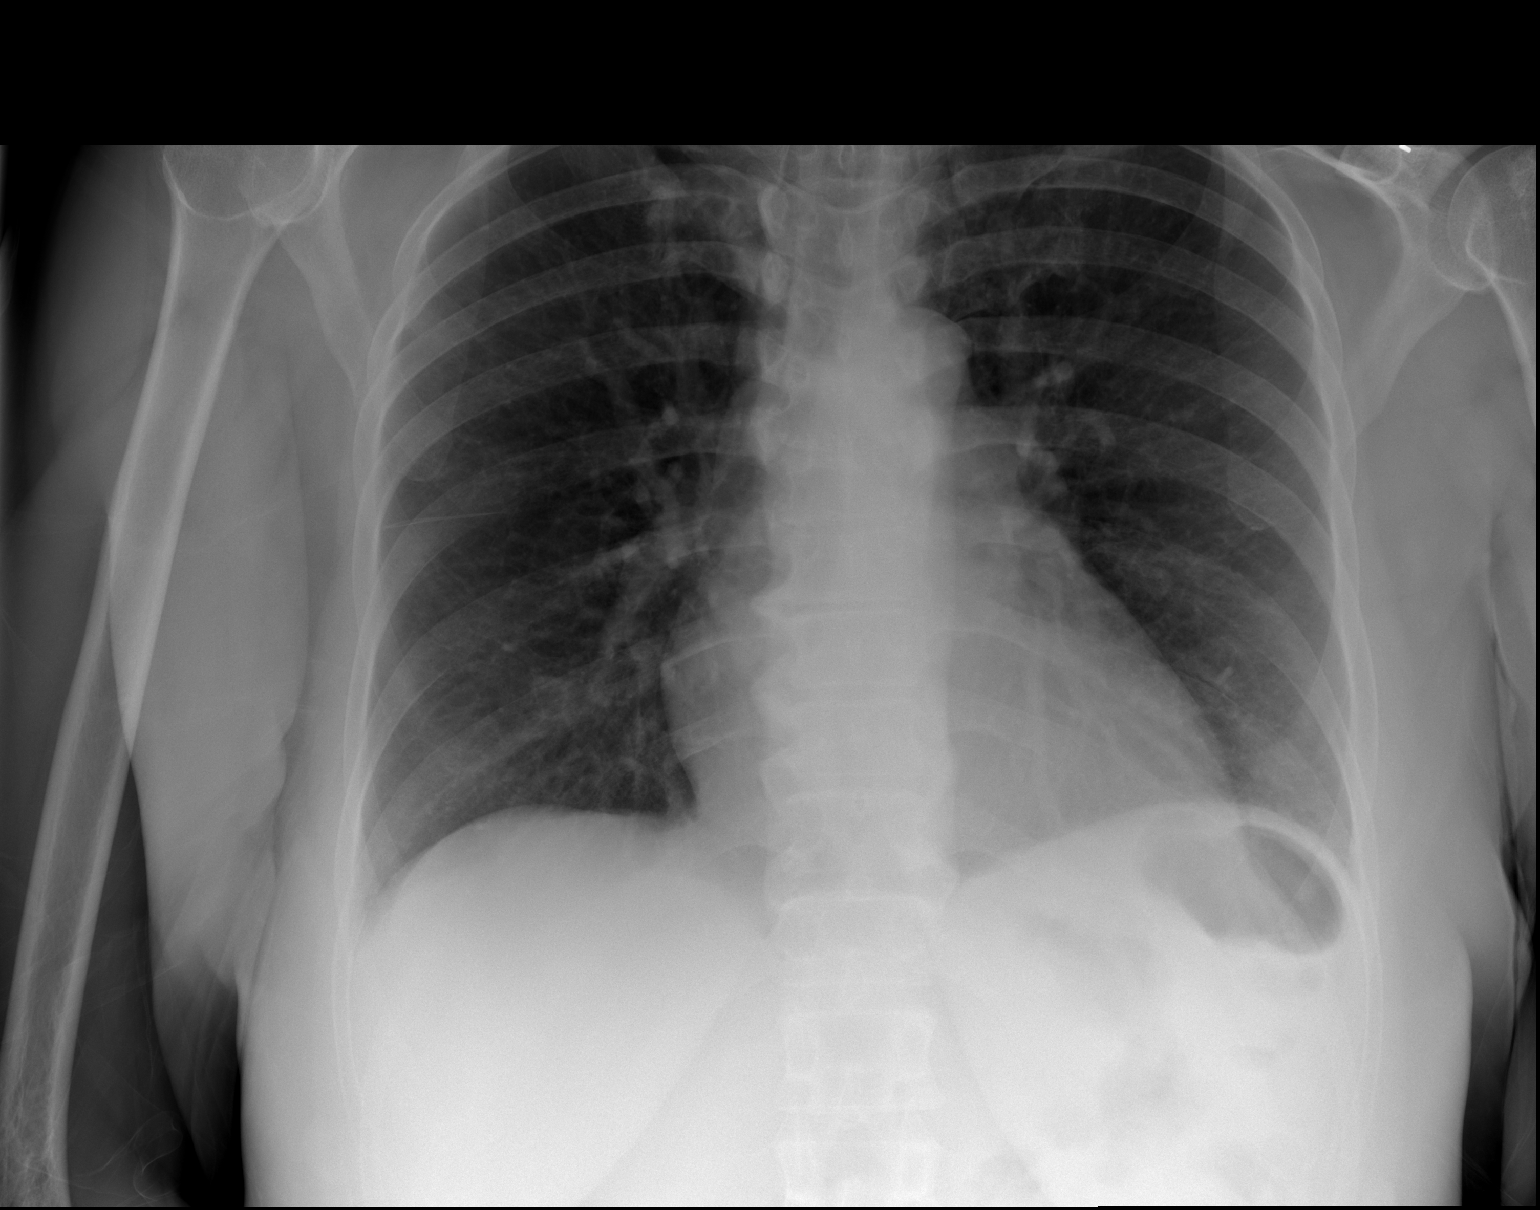

[2 of 2 positions shown; findings below may reference images not displayed]

FINDINGS: The cardiac silhouette is upper limits of normal in size. There is
minimal opacity in the left lung base. The right lung is clear. No
pleural effusion or pneumothorax is identified. No acute osseous
abnormality is identified.
IMPRESSION: Minimal left basilar opacity, likely atelectasis.

## 2022-11-28 ENCOUNTER — Ambulatory Visit
Admission: RE | Admit: 2022-11-28 | Discharge: 2022-11-28 | Disposition: A | Discharge status: Home / Self Care | Payer: BC Managed Care – PPO | Attending: Internal Medicine | Admitting: Internal Medicine

## 2022-11-28 ENCOUNTER — Ambulatory Visit
Admission: RE | Admit: 2022-11-28 | Discharge: 2022-11-28 | Disposition: A | Discharge status: Home / Self Care | Payer: BC Managed Care – PPO | Source: Ambulatory Visit | Attending: Internal Medicine | Admitting: Internal Medicine

## 2022-11-28 ENCOUNTER — Ambulatory Visit: Payer: BC Managed Care – PPO | Admitting: Internal Medicine

## 2022-11-28 ENCOUNTER — Encounter: Payer: Self-pay | Admitting: Internal Medicine

## 2022-11-28 VITALS — BP 138/82 | HR 68 | Temp 97.9°F | Resp 16 | Ht 65.0 in | Wt 154.9 lb

## 2022-11-28 DIAGNOSIS — Z122 Encounter for screening for malignant neoplasm of respiratory organs: Secondary | ICD-10-CM

## 2022-11-28 DIAGNOSIS — M25562 Pain in left knee: Secondary | ICD-10-CM | POA: Diagnosis not present

## 2022-11-28 DIAGNOSIS — Z114 Encounter for screening for human immunodeficiency virus [HIV]: Secondary | ICD-10-CM

## 2022-11-28 DIAGNOSIS — Z23 Encounter for immunization: Secondary | ICD-10-CM

## 2022-11-28 DIAGNOSIS — Z1231 Encounter for screening mammogram for malignant neoplasm of breast: Secondary | ICD-10-CM

## 2022-11-28 DIAGNOSIS — G8929 Other chronic pain: Secondary | ICD-10-CM | POA: Insufficient documentation

## 2022-11-28 DIAGNOSIS — Z0000 Encounter for general adult medical examination without abnormal findings: Secondary | ICD-10-CM

## 2022-11-28 DIAGNOSIS — Z1159 Encounter for screening for other viral diseases: Secondary | ICD-10-CM | POA: Diagnosis not present

## 2022-11-28 DIAGNOSIS — Z1211 Encounter for screening for malignant neoplasm of colon: Secondary | ICD-10-CM | POA: Diagnosis not present

## 2022-11-28 LAB — CBC WITH DIFFERENTIAL/PLATELET
Basophils Absolute: 30 cells/uL (ref 0–200)
Eosinophils Relative: 0.9 %
Hemoglobin: 13.1 g/dL (ref 11.7–15.5)
MCV: 82.2 fL (ref 80.0–100.0)
MPV: 10.9 fL (ref 7.5–12.5)
Monocytes Relative: 6.9 %
Neutro Abs: 4699 cells/uL (ref 1500–7800)
Total Lymphocyte: 28.3 %
WBC: 7.4 10*3/uL (ref 3.8–10.8)

## 2022-11-28 NOTE — Patient Instructions (Addendum)
It was great seeing you today!  Plan discussed at today's visit: -Blood work ordered today, results will be uploaded to [facility].  -Mammogram ordered, please call to schedule -Colon cancer screening ordered as well, Cologuard will most likely call first before sending the kit in the mail -Referral for lung cancer screening ordered -Tdap vaccine today -Please go to [facility] for knee x-ray  Follow up in: 1 year or sooner as needed  Take care and let us know if you have any questions or concerns prior to your next visit.  Dr. Rosana Berger
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
## 2022-11-29 LAB — COMPLETE METABOLIC PANEL WITH GFR
AG Ratio: 1.5 (calc) (ref 1.0–2.5)
ALT: 11 U/L (ref 6–29)
AST: 15 U/L (ref 10–35)
Albumin: 4.4 g/dL (ref 3.6–5.1)
Alkaline phosphatase (APISO): 79 U/L (ref 37–153)
BUN: 11 mg/dL (ref 7–25)
CO2: 28 mmol/L (ref 20–32)
Calcium: 9.8 mg/dL (ref 8.6–10.4)
Chloride: 105 mmol/L (ref 98–110)
Creat: 0.67 mg/dL (ref 0.50–1.05)
Globulin: 3 g/dL (calc) (ref 1.9–3.7)
Glucose, Bld: 85 mg/dL (ref 65–99)
Potassium: 4.9 mmol/L (ref 3.5–5.3)
Sodium: 142 mmol/L (ref 135–146)
Total Bilirubin: 0.3 mg/dL (ref 0.2–1.2)
Total Protein: 7.4 g/dL (ref 6.1–8.1)
eGFR: 99 mL/min/{1.73_m2} (ref >=60)

## 2022-11-29 LAB — CBC WITH DIFFERENTIAL/PLATELET
Absolute Monocytes: 511 cells/uL (ref 200–950)
Basophils Relative: 0.4 %
Eosinophils Absolute: 67 cells/uL (ref 15–500)
HCT: 40.3 % (ref 35.0–45.0)
Lymphs Abs: 2094 cells/uL (ref 850–3900)
MCH: 26.7 pg — ABNORMAL LOW (ref 27.0–33.0)
MCHC: 32.5 g/dL (ref 32.0–36.0)
Neutrophils Relative %: 63.5 %
Platelets: 329 10*3/uL (ref 140–400)
RBC: 4.9 10*6/uL (ref 3.80–5.10)
RDW: 13.1 % (ref 11.0–15.0)

## 2022-11-29 LAB — LIPID PANEL
Cholesterol: 197 mg/dL (ref <200)
HDL: 71 mg/dL (ref >=50)
LDL Cholesterol (Calc): 112 mg/dL (calc) — ABNORMAL HIGH
Non-HDL Cholesterol (Calc): 126 mg/dL (calc) (ref <130)
Total CHOL/HDL Ratio: 2.8 (calc) (ref <5.0)
Triglycerides: 57 mg/dL (ref <150)

## 2022-11-29 LAB — HEMOGLOBIN A1C
Hgb A1c MFr Bld: 6.1 % of total Hgb — ABNORMAL HIGH (ref <5.7)
Mean Plasma Glucose: 128 mg/dL
eAG (mmol/L): 7.1 mmol/L

## 2022-11-29 LAB — HEPATITIS C ANTIBODY: Hepatitis C Ab: NONREACTIVE

## 2022-11-29 LAB — HIV ANTIBODY (ROUTINE TESTING W REFLEX): HIV 1&2 Ab, 4th Generation: NONREACTIVE

## 2022-12-02 ENCOUNTER — Other Ambulatory Visit: Payer: Self-pay | Admitting: Internal Medicine

## 2022-12-02 ENCOUNTER — Telehealth: Payer: Self-pay | Admitting: Internal Medicine

## 2022-12-02 DIAGNOSIS — E782 Mixed hyperlipidemia: Secondary | ICD-10-CM

## 2022-12-02 MED ORDER — ROSUVASTATIN CALCIUM 10 MG PO TABS: 10.0000 mg | ORAL_TABLET | Freq: Every day | ORAL | 1 refills | Status: DC

## 2022-12-02 NOTE — Telephone Encounter (Signed)
Pt willing to start chol. Med please send in

## 2022-12-02 NOTE — Telephone Encounter (Signed)
Copied from [place] [id number]. Topic: General - Other >> Dec 02, 2022  2:41 PM Ja-Kwan M wrote: Reason for CRM: Pt stated she received a call from Dr. Rosana Berger' nurse on yesterday telling her that a Rx was sent to her pharmacy. Pt stated she went to her pharmacy but she was told that they had not received a Rx. Pt requests call back at [phone]

## 2022-12-14 NOTE — Progress Notes (Unsigned)
   Acute Office Visit  Subjective:     Patient ID: Tamara Barajas, female    DOB: [birth year], 62 y.o.   MRN: [id number]  No chief complaint on file.   HPI Patient is in today for anxiety.   Anxiety:  -Duration:{Blank single:19197::"controlled","uncontrolled","better","worse","exacerbated","stable"} -Anxious mood: {Blank single:19197::"yes","no"}  -Excessive worrying: {Blank single:19197::"yes","no"} -Irritability: {Blank single:19197::"yes","no"}  -Sweating: {Blank single:19197::"yes","no"} -Nausea: {Blank single:19197::"yes","no"} -Palpitations:{Blank single:19197::"yes","no"} -Hyperventilation: {Blank single:19197::"yes","no"} -Panic attacks: {Blank single:19197::"yes","no"} -Agoraphobia: {Blank single:19197::"yes","no"}  -Obsessions/compulsions: {Blank single:19197::"yes","no"} -Depressed mood: {Blank single:19197::"yes","no"}    11/28/2022   11:08 AM  Depression screen PHQ 2/9  Decreased Interest 0  Down, Depressed, Hopeless 0  PHQ - 2 Score 0  Altered sleeping 0  Tired, decreased energy 0  Change in appetite 0  Feeling bad or failure about yourself  0  Trouble concentrating 0  Moving slowly or fidgety/restless 0  Suicidal thoughts 0  PHQ-9 Score 0  Difficult doing work/chores Not difficult at all   -Anhedonia: {Blank single:19197::"yes","no"} -Weight changes: {Blank single:19197::"yes","no"} -Insomnia: {Blank single:19197::"yes","no"} {Blank single:19197::"hard to fall asleep","hard to stay asleep"}  -Hypersomnia: {Blank single:19197::"yes","no"} -Fatigue/loss of energy: {Blank single:19197::"yes","no"} -Feelings of worthlessness: {Blank single:19197::"yes","no"} -Feelings of guilt: {Blank single:19197::"yes","no"} -Impaired concentration/indecisiveness: {Blank single:19197::"yes","no"} -Suicidal ideations: {Blank single:19197::"yes","no"}  -Crying spells: {Blank single:19197::"yes","no"} -Recent Stressors/Life Changes: {Blank single:19197::"yes","no"}    Relationship problems: {Blank single:19197::"yes","no"}   Family stress: {Blank single:19197::"yes","no"}     Financial stress: {Blank single:19197::"yes","no"}    Job stress: {Blank single:19197::"yes","no"}    Recent death/loss: {Blank single:19197::"yes","no"} -Current Treatments: *** -Patient is compliant with the above medications at above dose and reports no side effects. *** -Past Treatments: -Counseling: ***   ROS      Objective:    LMP 10/14/2012  {Vitals History (Optional):23777}  Physical Exam  No results found for any visits on 12/15/22.      Assessment & Plan:   Problem List Items Addressed This Visit   None   No orders of the defined types were placed in this encounter.   No follow-ups on file.  Teodora Medici, DO

## 2022-12-15 ENCOUNTER — Encounter: Payer: Self-pay | Admitting: Internal Medicine

## 2022-12-15 ENCOUNTER — Ambulatory Visit: Payer: BC Managed Care – PPO | Admitting: Internal Medicine

## 2022-12-15 VITALS — BP 120/70 | HR 79 | Temp 98.0°F | Resp 16 | Ht 65.0 in | Wt 160.4 lb

## 2022-12-15 DIAGNOSIS — F4310 Post-traumatic stress disorder, unspecified: Secondary | ICD-10-CM | POA: Diagnosis not present

## 2022-12-15 DIAGNOSIS — F419 Anxiety disorder, unspecified: Secondary | ICD-10-CM | POA: Diagnosis not present

## 2022-12-15 DIAGNOSIS — E782 Mixed hyperlipidemia: Secondary | ICD-10-CM

## 2022-12-15 DIAGNOSIS — E278 Other specified disorders of adrenal gland: Secondary | ICD-10-CM | POA: Diagnosis not present

## 2022-12-15 MED ORDER — ROSUVASTATIN CALCIUM 10 MG PO TABS: 10.0000 mg | ORAL_TABLET | Freq: Every day | ORAL | 1 refills | Status: DC

## 2022-12-15 MED ORDER — BUSPIRONE HCL 5 MG PO TABS: 5.0000 mg | ORAL_TABLET | ORAL | 1 refills | Status: DC | PRN

## 2022-12-15 NOTE — Patient Instructions (Signed)
It was great seeing you today!  Plan discussed at today's visit: -Referral to Psychology placed -Start Buspar 5 mg as needed -Cholesterol medication sent as well   Follow up in: 6 weeks   Take care and let us know if you have any questions or concerns prior to your next visit.  Dr. Rosana Berger

## 2022-12-17 ENCOUNTER — Encounter: Payer: Self-pay | Admitting: Internal Medicine

## 2022-12-17 NOTE — Addendum Note (Signed)
Addended by: Teodora Medici on: 12/17/2022 08:47 AM   Modules accepted: Orders, Level of Service

## 2022-12-25 ENCOUNTER — Ambulatory Visit
Admission: RE | Admit: 2022-12-25 | Discharge: 2022-12-25 | Disposition: A | Discharge status: Home / Self Care | Payer: BC Managed Care – PPO | Source: Ambulatory Visit | Attending: Internal Medicine | Admitting: Internal Medicine

## 2022-12-25 DIAGNOSIS — Z0000 Encounter for general adult medical examination without abnormal findings: Secondary | ICD-10-CM | POA: Insufficient documentation

## 2022-12-25 DIAGNOSIS — Z1231 Encounter for screening mammogram for malignant neoplasm of breast: Secondary | ICD-10-CM | POA: Diagnosis not present

## 2022-12-29 ENCOUNTER — Other Ambulatory Visit: Payer: Self-pay | Admitting: Internal Medicine

## 2022-12-29 DIAGNOSIS — R921 Mammographic calcification found on diagnostic imaging of breast: Secondary | ICD-10-CM

## 2022-12-29 DIAGNOSIS — N6489 Other specified disorders of breast: Secondary | ICD-10-CM

## 2022-12-29 DIAGNOSIS — R928 Other abnormal and inconclusive findings on diagnostic imaging of breast: Secondary | ICD-10-CM

## 2022-12-30 ENCOUNTER — Ambulatory Visit
Admission: RE | Admit: 2022-12-30 | Discharge: 2022-12-30 | Disposition: A | Discharge status: Home / Self Care | Payer: BC Managed Care – PPO | Source: Ambulatory Visit | Attending: Internal Medicine | Admitting: Internal Medicine

## 2022-12-30 DIAGNOSIS — E278 Other specified disorders of adrenal gland: Secondary | ICD-10-CM

## 2022-12-30 DIAGNOSIS — D3502 Benign neoplasm of left adrenal gland: Secondary | ICD-10-CM | POA: Diagnosis not present

## 2023-01-05 ENCOUNTER — Other Ambulatory Visit: Payer: BC Managed Care – PPO

## 2023-01-05 ENCOUNTER — Inpatient Hospital Stay: Admission: RE | Admit: 2023-01-05 | Payer: BC Managed Care – PPO | Source: Ambulatory Visit

## 2023-01-14 ENCOUNTER — Ambulatory Visit
Admission: RE | Admit: 2023-01-14 | Discharge: 2023-01-14 | Disposition: A | Discharge status: Home / Self Care | Payer: BC Managed Care – PPO | Source: Ambulatory Visit | Attending: Internal Medicine | Admitting: Internal Medicine

## 2023-01-14 DIAGNOSIS — R928 Other abnormal and inconclusive findings on diagnostic imaging of breast: Secondary | ICD-10-CM

## 2023-01-14 DIAGNOSIS — N6489 Other specified disorders of breast: Secondary | ICD-10-CM

## 2023-01-14 DIAGNOSIS — R922 Inconclusive mammogram: Secondary | ICD-10-CM | POA: Diagnosis not present

## 2023-01-14 DIAGNOSIS — R921 Mammographic calcification found on diagnostic imaging of breast: Secondary | ICD-10-CM | POA: Diagnosis not present

## 2023-01-15 ENCOUNTER — Other Ambulatory Visit: Payer: Self-pay | Admitting: Internal Medicine

## 2023-01-15 DIAGNOSIS — R921 Mammographic calcification found on diagnostic imaging of breast: Secondary | ICD-10-CM

## 2023-01-15 DIAGNOSIS — R928 Other abnormal and inconclusive findings on diagnostic imaging of breast: Secondary | ICD-10-CM

## 2023-01-16 DIAGNOSIS — Z1211 Encounter for screening for malignant neoplasm of colon: Secondary | ICD-10-CM | POA: Diagnosis not present

## 2023-01-22 ENCOUNTER — Ambulatory Visit
Admission: RE | Admit: 2023-01-22 | Discharge: 2023-01-22 | Disposition: A | Discharge status: Home / Self Care | Payer: BC Managed Care – PPO | Source: Ambulatory Visit | Attending: Internal Medicine | Admitting: Internal Medicine

## 2023-01-22 DIAGNOSIS — R928 Other abnormal and inconclusive findings on diagnostic imaging of breast: Secondary | ICD-10-CM

## 2023-01-22 DIAGNOSIS — R921 Mammographic calcification found on diagnostic imaging of breast: Secondary | ICD-10-CM

## 2023-01-22 DIAGNOSIS — N6021 Fibroadenosis of right breast: Secondary | ICD-10-CM | POA: Diagnosis not present

## 2023-01-22 HISTORY — PX: BREAST BIOPSY: SHX20

## 2023-01-22 MED ORDER — LIDOCAINE HCL (PF) 1 % IJ SOLN
5.0000 mL | Freq: Once | INTRAMUSCULAR | Status: AC
  Administered 2023-01-22: 5 mL
  Filled 2023-01-22: qty 5

## 2023-01-22 MED ORDER — LIDOCAINE-EPINEPHRINE 1 %-1:100000 IJ SOLN
20.0000 mL | Freq: Once | INTRAMUSCULAR | Status: AC
  Administered 2023-01-22: 20 mL
  Filled 2023-01-22: qty 20

## 2023-01-23 LAB — SURGICAL PATHOLOGY

## 2023-01-26 LAB — COLOGUARD: COLOGUARD: NEGATIVE
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
## 2023-01-27 ENCOUNTER — Ambulatory Visit (INDEPENDENT_AMBULATORY_CARE_PROVIDER_SITE_OTHER): Payer: BC Managed Care – PPO | Admitting: Internal Medicine

## 2023-01-27 ENCOUNTER — Encounter: Payer: Self-pay | Admitting: Internal Medicine

## 2023-01-27 VITALS — BP 112/62 | HR 93 | Temp 98.0°F | Resp 16 | Ht 65.0 in | Wt 157.0 lb

## 2023-01-27 DIAGNOSIS — F4310 Post-traumatic stress disorder, unspecified: Secondary | ICD-10-CM

## 2023-01-27 DIAGNOSIS — F419 Anxiety disorder, unspecified: Secondary | ICD-10-CM

## 2023-01-27 DIAGNOSIS — R928 Other abnormal and inconclusive findings on diagnostic imaging of breast: Secondary | ICD-10-CM | POA: Diagnosis not present

## 2023-01-27 DIAGNOSIS — E278 Other specified disorders of adrenal gland: Secondary | ICD-10-CM

## 2023-01-27 NOTE — Patient Instructions (Signed)
It was great seeing you today!  Plan discussed at today's visit: -Continue Buspar as needed  Follow up in: 6 months, plan for physical in February   Take care and let us know if you have any questions or concerns prior to your next visit.  Dr. Rosana Berger
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
## 2023-07-28 ENCOUNTER — Ambulatory Visit (INDEPENDENT_AMBULATORY_CARE_PROVIDER_SITE_OTHER): Payer: BC Managed Care – PPO | Admitting: Internal Medicine

## 2023-07-28 ENCOUNTER — Encounter: Payer: Self-pay | Admitting: Internal Medicine

## 2023-07-28 VITALS — BP 122/74 | HR 73 | Temp 97.9°F | Resp 16 | Ht 65.0 in | Wt 167.3 lb

## 2023-07-28 DIAGNOSIS — E782 Mixed hyperlipidemia: Secondary | ICD-10-CM | POA: Insufficient documentation

## 2023-07-28 DIAGNOSIS — R7303 Prediabetes: Secondary | ICD-10-CM | POA: Insufficient documentation

## 2023-07-28 DIAGNOSIS — F419 Anxiety disorder, unspecified: Secondary | ICD-10-CM

## 2023-07-28 MED ORDER — ROSUVASTATIN CALCIUM 10 MG PO TABS: 10.0000 mg | ORAL_TABLET | Freq: Every day | ORAL | 1 refills | Status: DC

## 2023-11-30 ENCOUNTER — Encounter: Payer: BC Managed Care – PPO | Admitting: Internal Medicine

## 2023-12-01 ENCOUNTER — Other Ambulatory Visit (HOSPITAL_COMMUNITY)
Admission: RE | Admit: 2023-12-01 | Discharge: 2023-12-01 | Disposition: A | Discharge status: Home / Self Care | Payer: BC Managed Care – PPO | Source: Ambulatory Visit | Attending: Internal Medicine | Admitting: Internal Medicine

## 2023-12-01 ENCOUNTER — Encounter: Payer: Self-pay | Admitting: Internal Medicine

## 2023-12-01 ENCOUNTER — Ambulatory Visit (INDEPENDENT_AMBULATORY_CARE_PROVIDER_SITE_OTHER): Payer: BC Managed Care – PPO | Admitting: Internal Medicine

## 2023-12-01 ENCOUNTER — Other Ambulatory Visit: Payer: Self-pay

## 2023-12-01 VITALS — BP 126/78 | HR 94 | Temp 98.0°F | Resp 16 | Ht 65.0 in | Wt 168.4 lb

## 2023-12-01 DIAGNOSIS — Z0000 Encounter for general adult medical examination without abnormal findings: Secondary | ICD-10-CM | POA: Insufficient documentation

## 2023-12-01 DIAGNOSIS — E782 Mixed hyperlipidemia: Secondary | ICD-10-CM

## 2023-12-01 DIAGNOSIS — A599 Trichomoniasis, unspecified: Secondary | ICD-10-CM

## 2023-12-01 DIAGNOSIS — M25462 Effusion, left knee: Secondary | ICD-10-CM | POA: Diagnosis not present

## 2023-12-01 DIAGNOSIS — Z124 Encounter for screening for malignant neoplasm of cervix: Secondary | ICD-10-CM

## 2023-12-01 DIAGNOSIS — Z0001 Encounter for general adult medical examination with abnormal findings: Secondary | ICD-10-CM

## 2023-12-01 DIAGNOSIS — R7303 Prediabetes: Secondary | ICD-10-CM | POA: Diagnosis not present

## 2023-12-01 NOTE — Progress Notes (Signed)
 Name: Tamara Barajas   MRN: [id number]    DOB: [birth year]   Date:12/01/2023       Progress Note  Subjective  Chief Complaint  Chief Complaint  Patient presents with   Annual Exam    HPI  Patient presents for annual CPE.  Diet: Regular; well rounded  Exercise: None  Last Eye Exam:  will schedule Last Dental Exam: will schedule  Flowsheet Row Office Visit from 12/01/2023 in [facility]  AUDIT-C Score 0      Depression: Phq 9 is  negative    12/01/2023    1:05 PM 07/28/2023    1:24 PM 01/27/2023    2:46 PM 12/15/2022    2:51 PM 12/15/2022    2:46 PM  Depression screen PHQ 2/9  Decreased Interest 0 0 0 0 0  Down, Depressed, Hopeless 0 0 0 0 0  PHQ - 2 Score 0 0 0 0 0  Altered sleeping  0 0 0 0  Tired, decreased energy  0 0 0 0  Change in appetite  0 0 0 0  Feeling bad or failure about yourself   0 0 0 0  Trouble concentrating  0 0 0 0  Moving slowly or fidgety/restless  0 0 0 0  Suicidal thoughts  0 0 0 0  PHQ-9 Score  0 0 0 0  Difficult doing work/chores  Not difficult at all Not difficult at all Not difficult at all Not difficult at all   Hypertension: BP Readings from Last 3 Encounters:  07/28/23 122/74  01/27/23 112/62  12/15/22 120/70   Obesity: Wt Readings from Last 3 Encounters:  12/01/23 168 lb 6.4 oz (76.4 kg)  07/28/23 167 lb 4.8 oz (75.9 kg)  01/27/23 157 lb (71.2 kg)   BMI Readings from Last 3 Encounters:  12/01/23 28.02 kg/m  07/28/23 27.84 kg/m  01/27/23 26.13 kg/m     Vaccines: Shingles reviewed with the patient.   Hep C Screening: completed STD testing and prevention (HIV/chl/gon/syphilis): none Intimate partner violence: negative screen  LMP: 5-10 years ago, went throuh menopause naturally, no issues Discussed importance of follow up if any post-menopausal bleeding: yes  Incontinence Symptoms: negative for symptoms   Breast cancer:  - Last Mammogram: 01/14/2023  Osteoporosis Prevention : Discussed high calcium   and vitamin D supplementation, weight bearing exercises Bone density :no   Cervical cancer screening: performing today  Skin cancer: Discussed monitoring for atypical lesions  Colorectal cancer: Cologuard 01/16/2023, negative  Lung cancer:  Low Dose CT Chest recommended if Age 61-80 years, 20 pack-year currently smoking OR have quit w/in 15years. Patient does qualify for screen   ECG: 05/12/2018  Advanced Care Planning: A voluntary discussion about advance care planning including the explanation and discussion of advance directives.  Discussed health care proxy and Living will, and the patient was able to identify a health care proxy as Naomie Daring (mother).  Patient does have a living will and power of attorney of health care   Patient Active Problem List   Diagnosis Date Noted   Mixed hyperlipidemia 07/28/2023   Prediabetes 07/28/2023    Past Surgical History:  Procedure Laterality Date   BREAST BIOPSY Right 01/22/2023   stereo biopsy/ x clip/ path pending   BREAST BIOPSY Right 01/22/2023   MM RT BREAST BX W LOC DEV 1ST LESION IMAGE BX SPEC STEREO GUIDE 01/22/2023 [facility]    Family History  Problem Relation Age of Onset   Hypertension Mother  Diverticulitis Mother    Liver cancer Father    Hepatitis C Father    Diabetes Maternal Grandmother    Diabetes Maternal Grandfather     Social History   Socioeconomic History   Marital status: Single    Spouse name: Not on file   Number of children: Not on file   Years of education: Not on file   Highest education level: Not on file  Occupational History   Not on file  Tobacco Use   Smoking status: Every Day    Current packs/day: 0.25    Types: Cigarettes   Smokeless tobacco: Never  Vaping Use   Vaping status: Every Day   Substances: Nicotine, Flavoring  Substance and Sexual Activity   Alcohol use: Yes    Comment: occasionally   Drug use: Yes    Types: Marijuana   Sexual activity: Yes  Other Topics Concern    Not on file  Social History Narrative   Not on file   Social Drivers of Health   Financial Resource Strain: Low Risk  (12/01/2023)   Overall Financial Resource Strain (CARDIA)    Difficulty of Paying Living Expenses: Not hard at all  Food Insecurity: No Food Insecurity (12/01/2023)   Hunger Vital Sign    Worried About Running Out of Food in the Last Year: Never true    Ran Out of Food in the Last Year: Never true  Transportation Needs: No Transportation Needs (12/01/2023)   PRAPARE - Administrator, Civil Service (Medical): No    Lack of Transportation (Non-Medical): No  Physical Activity: Inactive (12/01/2023)   Exercise Vital Sign    Days of Exercise per Week: 0 days    Minutes of Exercise per Session: 0 min  Stress: No Stress Concern Present (12/01/2023)   [employer] of Occupational Health - Occupational Stress Questionnaire    Feeling of Stress : Only a little  Social Connections: Moderately Isolated (12/01/2023)   Social Connection and Isolation Panel [NHANES]    Frequency of Communication with Friends and Family: More than three times a week    Frequency of Social Gatherings with Friends and Family: More than three times a week    Attends Religious Services: 1 to 4 times per year    Active Member of [employer] or Organizations: No    Attends Banker Meetings: Never    Marital Status: Never married  Intimate Partner Violence: Not At Risk (12/01/2023)   Humiliation, Afraid, Rape, and Kick questionnaire    Fear of Current or Ex-Partner: No    Emotionally Abused: No    Physically Abused: No    Sexually Abused: No     Current Outpatient Medications:    busPIRone  (BUSPAR ) 5 MG tablet, Take 1 tablet (5 mg total) by mouth as needed., Disp: 30 tablet, Rfl: 1   ibuprofen  (ADVIL ,MOTRIN ) 600 MG tablet, Take 1 tablet (600 mg total) by mouth every 6 (six) hours as needed., Disp: 30 tablet, Rfl: 0   orphenadrine (NORFLEX) 100 MG tablet, Take 100 mg by mouth 2 (two)  times daily as needed., Disp: , Rfl:    rosuvastatin  (CRESTOR ) 10 MG tablet, Take 1 tablet (10 mg total) by mouth daily., Disp: 90 tablet, Rfl: 1  No Known Allergies   Review of Systems  Musculoskeletal:  Positive for joint pain.  All other systems reviewed and are negative.   Objective  Vitals:   12/01/23 1310  Pulse: 94  Resp: 16  Temp: 98 F (  36.7 C)  TempSrc: Oral  SpO2: 96%  Weight: 168 lb 6.4 oz (76.4 kg)  Height: 5' 5 (1.651 m)    Body mass index is 28.02 kg/m.  Physical Exam Exam conducted with a chaperone present.  Constitutional:      Appearance: Normal appearance.  HENT:     Head: Normocephalic and atraumatic.  Eyes:     Conjunctiva/sclera: Conjunctivae normal.  Cardiovascular:     Rate and Rhythm: Normal rate and regular rhythm.  Pulmonary:     Effort: Pulmonary effort is normal.     Breath sounds: Normal breath sounds.  Chest:  Breasts:    Right: Normal.     Left: Normal.  Genitourinary:    Comments: External genitalia within normal limits.  Vaginal mucosa pink, moist, normal rugae.  Nonfriable cervix without lesions, no discharge or bleeding noted on speculum exam.  Bimanual exam revealed normal, nongravid uterus.  No cervical motion tenderness. No adnexal masses bilaterally.    Lymphadenopathy:     Upper Body:     Right upper body: No supraclavicular, axillary or pectoral adenopathy.     Left upper body: No supraclavicular, axillary or pectoral adenopathy.  Skin:    General: Skin is warm and dry.  Neurological:     General: No focal deficit present.     Mental Status: She is alert. Mental status is at baseline.  Psychiatric:        Mood and Affect: Mood normal.        Behavior: Behavior normal.     Last CBC Lab Results  Component Value Date   WBC 7.4 11/28/2022   HGB 13.1 11/28/2022   HCT 40.3 11/28/2022   MCV 82.2 11/28/2022   MCH 26.7 (L) 11/28/2022   RDW 13.1 11/28/2022   PLT 329 11/28/2022   Last metabolic panel Lab  Results  Component Value Date   GLUCOSE 85 11/28/2022   NA 142 11/28/2022   K 4.9 11/28/2022   CL 105 11/28/2022   CO2 28 11/28/2022   BUN 11 11/28/2022   CREATININE 0.67 11/28/2022   EGFR 99 11/28/2022   CALCIUM  9.8 11/28/2022   PROT 7.4 11/28/2022   BILITOT 0.3 11/28/2022   AST 15 11/28/2022   ALT 11 11/28/2022   ANIONGAP 8 05/11/2018   Last lipids Lab Results  Component Value Date   CHOL 197 11/28/2022   HDL 71 11/28/2022   LDLCALC 112 (H) 11/28/2022   TRIG 57 11/28/2022   CHOLHDL 2.8 11/28/2022   Last hemoglobin A1c Lab Results  Component Value Date   HGBA1C 6.1 (H) 11/28/2022   Last thyroid functions No results found for: TSH, T3TOTAL, T4TOTAL, THYROIDAB Last vitamin D No results found for: 25OHVITD2, 25OHVITD3, VD25OH Last vitamin B12 and Folate No results found for: VITAMINB12, FOLATE    Assessment & Plan  1. Annual physical exam (Primary)/Prediabetes/Mixed hyperlipidemia: Physical exam completed, health maintenance reviewed and annual labs ordered.   - CBC w/Diff/Platelet - COMPLETE METABOLIC PANEL WITH GFR - Lipid Profile - HgB A1c  2. Cervical cancer screening: Pap done today.  - Cytology - PAP  3. Effusion of left knee: Pain with effusion, will place referral to Ortho.   - AMB referral to orthopedics   -USPSTF grade A and B recommendations reviewed with patient; age-appropriate recommendations, preventive care, screening tests, etc discussed and encouraged; healthy living encouraged; see AVS for patient education given to patient -Discussed importance of 150 minutes of physical activity weekly, eat two servings of fish weekly, eat  one serving of tree nuts ( cashews, pistachios, pecans, almonds.SABRA) every other day, eat 6 servings of fruit/vegetables daily and drink plenty of water and avoid sweet beverages.   -Reviewed Health Maintenance: Yes.

## 2023-12-01 NOTE — Patient Instructions (Addendum)
 It was great seeing you today!  Plan discussed at today's visit: -Blood work ordered today, results will be uploaded to MyChart. Please be fasting 8-12 hours prior to labs. Lab is open here every week day 8:30-11:30 and 1:30-3:30 -Would qualify for Shingles vaccine, can get at pharmacy   Follow up in: 6 months or sooner as needed  Take care and let us  know if you have any questions or concerns prior to your next visit.  Dr. Bernardo

## 2023-12-07 ENCOUNTER — Telehealth: Payer: Self-pay

## 2023-12-07 LAB — CYTOLOGY - PAP
Adequacy: ABSENT
Comment: NEGATIVE
Diagnosis: NEGATIVE
High risk HPV: NEGATIVE

## 2023-12-07 MED ORDER — METRONIDAZOLE 500 MG PO TABS
500.0000 mg | ORAL_TABLET | Freq: Two times a day (BID) | ORAL | 0 refills | Status: AC
Start: 2023-12-07 — End: 2023-12-14

## 2023-12-07 NOTE — Telephone Encounter (Signed)
 Pt given lab results per notes of Dr. Bud Care on 12/07/23. Pt verbalized understanding.   Rockney Cid, DO 12/07/2023  1:02 PM EST     Pap negative but showing a trichomonas infection, I am sending an antibiotic to her pharmacy to treat this.

## 2023-12-07 NOTE — Addendum Note (Signed)
 Addended by: Rockney Cid on: 12/07/2023 01:02 PM   Modules accepted: Orders

## 2023-12-24 ENCOUNTER — Other Ambulatory Visit: Payer: Self-pay | Admitting: Internal Medicine

## 2023-12-24 DIAGNOSIS — Z1231 Encounter for screening mammogram for malignant neoplasm of breast: Secondary | ICD-10-CM

## 2024-01-15 ENCOUNTER — Ambulatory Visit
Admission: RE | Admit: 2024-01-15 | Discharge: 2024-01-15 | Disposition: A | Discharge status: Home / Self Care | Payer: BC Managed Care – PPO | Source: Ambulatory Visit | Attending: Internal Medicine | Admitting: Internal Medicine

## 2024-01-15 DIAGNOSIS — Z1231 Encounter for screening mammogram for malignant neoplasm of breast: Secondary | ICD-10-CM | POA: Insufficient documentation

## 2024-01-19 ENCOUNTER — Encounter: Payer: Self-pay | Admitting: Internal Medicine

## 2024-02-26 ENCOUNTER — Telehealth: Payer: Self-pay

## 2024-02-26 DIAGNOSIS — E782 Mixed hyperlipidemia: Secondary | ICD-10-CM

## 2024-02-26 MED ORDER — ROSUVASTATIN CALCIUM 10 MG PO TABS: 10.0000 mg | ORAL_TABLET | Freq: Every day | ORAL | 0 refills | Status: DC

## 2024-02-26 NOTE — Telephone Encounter (Signed)
 Copied from CRM [id number]. Topic: Clinical - Medication Refill >> Feb 26, 2024  3:30 PM Sophia H wrote: Most Recent Primary Care Visit:  Provider: Rockney Cid  Department: [facility]  Visit Type: PHYSICAL  Date: 12/01/2023  Medication: rosuvastatin  (CRESTOR ) 10 MG tablet   Has the patient contacted their pharmacy? Yes (Agent: If no, request that the patient contact the pharmacy for the refill. If patient does not wish to contact the pharmacy document the reason why and proceed with request.) (Agent: If yes, when and what did the pharmacy advise?)  Is this the correct pharmacy for this prescription? Yes If no, delete pharmacy and type the correct one.  This is the patient's preferred pharmacy:  [facility] [address]), [place] - [address] [place]) [place] [address] Phone: [phone] Fax: [phone]   Has the prescription been filled recently? No  Is the patient out of the medication? Yes  Has the patient been seen for an appointment in the last year OR does the patient have an upcoming appointment? Yes  Can we respond through MyChart? Yes  Agent: Please be advised that Rx refills may take up to 3 business days. We ask that you follow-up with your pharmacy.  **Please advise pt if she needs to come in for an appt or if the medication can just be called in. Ty, [phone]

## 2024-02-26 NOTE — Telephone Encounter (Signed)
 Pt notified, needs labs never done at CPE, 30 day supply given

## 2024-03-03 LAB — COMPREHENSIVE METABOLIC PANEL WITH GFR
AG Ratio: 1.5 (calc) (ref 1.0–2.5)
ALT: 15 U/L (ref 6–29)
AST: 16 U/L (ref 10–35)
Albumin: 4 g/dL (ref 3.6–5.1)
Alkaline phosphatase (APISO): 73 U/L (ref 37–153)
BUN: 14 mg/dL (ref 7–25)
CO2: 32 mmol/L (ref 20–32)
Calcium: 9.8 mg/dL (ref 8.6–10.4)
Chloride: 107 mmol/L (ref 98–110)
Creat: 0.72 mg/dL (ref 0.50–1.05)
Globulin: 2.7 g/dL (ref 1.9–3.7)
Glucose, Bld: 99 mg/dL (ref 65–99)
Potassium: 5.3 mmol/L (ref 3.5–5.3)
Sodium: 142 mmol/L (ref 135–146)
Total Bilirubin: 0.5 mg/dL (ref 0.2–1.2)
Total Protein: 6.7 g/dL (ref 6.1–8.1)
eGFR: 94 mL/min/{1.73_m2} (ref >=60)

## 2024-03-03 LAB — LIPID PANEL
Cholesterol: 187 mg/dL (ref <200)
HDL: 63 mg/dL (ref >=50)
LDL Cholesterol (Calc): 110 mg/dL — ABNORMAL HIGH
Non-HDL Cholesterol (Calc): 124 mg/dL (ref <130)
Total CHOL/HDL Ratio: 3 (calc) (ref <5.0)
Triglycerides: 48 mg/dL (ref <150)

## 2024-03-03 LAB — CBC WITH DIFFERENTIAL/PLATELET
Absolute Lymphocytes: 2042 {cells}/uL (ref 850–3900)
Absolute Monocytes: 504 {cells}/uL (ref 200–950)
Basophils Absolute: 28 {cells}/uL (ref 0–200)
Basophils Relative: 0.4 %
Eosinophils Absolute: 117 {cells}/uL (ref 15–500)
Eosinophils Relative: 1.7 %
HCT: 41.3 % (ref 35.0–45.0)
Hemoglobin: 13.1 g/dL (ref 11.7–15.5)
MCH: 26.4 pg — ABNORMAL LOW (ref 27.0–33.0)
MCHC: 31.7 g/dL — ABNORMAL LOW (ref 32.0–36.0)
MCV: 83.3 fL (ref 80.0–100.0)
MPV: 12.2 fL (ref 7.5–12.5)
Monocytes Relative: 7.3 %
Neutro Abs: 4209 {cells}/uL (ref 1500–7800)
Neutrophils Relative %: 61 %
Platelets: 262 10*3/uL (ref 140–400)
RBC: 4.96 10*6/uL (ref 3.80–5.10)
RDW: 13 % (ref 11.0–15.0)
Total Lymphocyte: 29.6 %
WBC: 6.9 10*3/uL (ref 3.8–10.8)

## 2024-03-03 LAB — HEMOGLOBIN A1C
Hgb A1c MFr Bld: 6.3 % — ABNORMAL HIGH (ref <5.7)
Mean Plasma Glucose: 134 mg/dL
eAG (mmol/L): 7.4 mmol/L

## 2024-03-07 ENCOUNTER — Other Ambulatory Visit: Payer: Self-pay | Admitting: Internal Medicine

## 2024-03-07 DIAGNOSIS — R7303 Prediabetes: Secondary | ICD-10-CM

## 2024-03-07 MED ORDER — METFORMIN HCL 500 MG PO TABS
500.0000 mg | ORAL_TABLET | Freq: Every day | ORAL | 1 refills | Status: DC
Start: 2024-03-07 — End: 2024-04-05

## 2024-04-04 ENCOUNTER — Ambulatory Visit: Payer: Self-pay

## 2024-04-04 ENCOUNTER — Ambulatory Visit: Admitting: Internal Medicine

## 2024-04-04 NOTE — Telephone Encounter (Signed)
      Patient was last seen in primary care on 12/01/2023 by Rockney Cid, DO. Called Nurse Triage reporting Hypertension. Symptoms began today. Interventions attempted: Nothing. Symptoms are: gradually improving.  Triage Disposition: See Today or Tomorrow in Office (overriding See PCP When Office is Open (Within 3 Days))  Patient/caregiver understands and will follow disposition?: Yes    Reason for Disposition  Systolic BP  >= 160 OR Diastolic >= 100    Dizziness subsided  Answer Assessment - Initial Assessment Questions 1. BLOOD PRESSURE: "What is the blood pressure?" "Did you take at least two measurements 5 minutes apart?"     166/105 2. ONSET: "When did you take your blood pressure?"   This am  3. HOW: "How did you take your blood pressure?" (e.g., automatic home BP monitor, visiting nurse)     Work nurse 4. HISTORY: "Do you have a history of high blood pressure?"     no 5. MEDICINES: "Are you taking any medicines for blood pressure?" "Have you missed any doses recently?"     no 6. OTHER SYMPTOMS: "Do you have any symptoms?" (e.g., blurred vision, chest pain, difficulty breathing, headache, weakness)     Awoke dizzy, bent down and felt off balance,  7. PREGNANCY: "Is there any chance you are pregnant?" "When was your last menstrual period?"     N/a  Protocols used: Blood Pressure - High-A-AH

## 2024-04-05 ENCOUNTER — Encounter: Payer: Self-pay | Admitting: Internal Medicine

## 2024-04-05 ENCOUNTER — Other Ambulatory Visit: Payer: Self-pay

## 2024-04-05 ENCOUNTER — Ambulatory Visit (INDEPENDENT_AMBULATORY_CARE_PROVIDER_SITE_OTHER): Admitting: Internal Medicine

## 2024-04-05 VITALS — BP 148/76 | HR 59 | Temp 97.9°F | Resp 16 | Ht 65.0 in | Wt 171.1 lb

## 2024-04-05 DIAGNOSIS — R7303 Prediabetes: Secondary | ICD-10-CM

## 2024-04-05 DIAGNOSIS — E782 Mixed hyperlipidemia: Secondary | ICD-10-CM

## 2024-04-05 DIAGNOSIS — R030 Elevated blood-pressure reading, without diagnosis of hypertension: Secondary | ICD-10-CM | POA: Diagnosis not present

## 2024-04-05 MED ORDER — BLOOD PRESSURE KIT KIT
1.0000 | PACK | Freq: Every day | 0 refills | Status: AC
Start: 2024-04-05 — End: ?

## 2024-04-05 MED ORDER — METFORMIN HCL 500 MG PO TABS: 500.0000 mg | ORAL_TABLET | Freq: Every day | ORAL | 1 refills | Status: DC

## 2024-04-05 MED ORDER — ROSUVASTATIN CALCIUM 10 MG PO TABS: 10.0000 mg | ORAL_TABLET | Freq: Every day | ORAL | 1 refills | Status: DC

## 2024-04-05 NOTE — Patient Instructions (Signed)
 How to Take Your Blood Pressure Blood pressure measures how strongly your blood is pressing against the walls of your arteries. Arteries are blood vessels that carry blood from your heart throughout your body. You can take your blood pressure at home with a machine. You may need to check your blood pressure at home: To check if you have high blood pressure (hypertension). To check your blood pressure over time. To make sure your blood pressure medicine is working. Supplies needed: Blood pressure machine, or monitor. A chair to sit in. This should be a chair where you can sit upright with your back supported. Do not sit on a soft couch or an armchair. Table or desk. Small notebook. Pencil or pen. How to prepare Avoid these things for 30 minutes before checking your blood pressure: Having drinks with caffeine in them, such as coffee or tea. Drinking alcohol. Eating. Smoking. Exercising. Do these things five minutes before checking your blood pressure: Go to the bathroom and pee (urinate). Sit in a chair. Be quiet. Do not talk. How to take your blood pressure Follow the instructions that came with your machine. If you have a digital blood pressure monitor, these may be the instructions: Sit up straight. Place your feet on the floor. Do not cross your ankles or legs. Rest your left arm at the level of your heart. You may rest it on a table, desk, or chair. Pull up your shirt sleeve. Wrap the blood pressure cuff around the upper part of your left arm. The cuff should be 1 inch (2.5 cm) above your elbow. It is best to wrap the cuff around bare skin. Fit the cuff snugly around your arm, but not too tightly. You should be able to place only one finger between the cuff and your arm. Place the cord so that it rests in the bend of your elbow. Press the power button. Sit quietly while the cuff fills with air and loses air. Write down the numbers on the screen. Wait 2-3 minutes and then repeat  steps 1-10. What do the numbers mean? Two numbers make up your blood pressure. The first number is called systolic pressure. The second is called diastolic pressure. An example of a blood pressure reading is "120 over 80" (or 120/80). If you are an adult and do not have a medical condition, use this guide to find out if your blood pressure is normal: Normal First number: below 120. Second number: below 80. Elevated First number: 120-129. Second number: below 80. Hypertension stage 1 First number: 130-139. Second number: 80-89. Hypertension stage 2 First number: 140 or above. Second number: 90 or above. Your blood pressure is above normal even if only the first or only the second number is above normal. Follow these instructions at home: Medicines Take over-the-counter and prescription medicines only as told by your doctor. Tell your doctor if your medicine is causing side effects. General instructions Check your blood pressure as often as your doctor tells you to. Check your blood pressure at the same time every day. Take your monitor to your next doctor's appointment. Your doctor will: Make sure you are using it correctly. Make sure it is working right. Understand what your blood pressure numbers should be. Keep all follow-up visits. General tips You will need a blood pressure machine or monitor. Your doctor can suggest a monitor. You can buy one at a drugstore or online. When choosing one: Choose one with an arm cuff. Choose one that wraps around your  upper arm. Only one finger should fit between your arm and the cuff. Do not choose one that measures your blood pressure from your wrist or finger. Where to find more information American Heart Association: www.heart.org Contact a doctor if: Your blood pressure keeps being high. Your blood pressure is suddenly low. Get help right away if: Your first blood pressure number is higher than 180. Your second blood pressure number is  higher than 120. These symptoms may be an emergency. Do not wait to see if the symptoms will go away. Get help right away. Call 911. Summary Check your blood pressure at the same time every day. Avoid caffeine, alcohol, smoking, and exercise for 30 minutes before checking your blood pressure. Make sure you understand what your blood pressure numbers should be. This information is not intended to replace advice given to you by your health care provider. Make sure you discuss any questions you have with your health care provider. Document Revised: 06/27/2021 Document Reviewed: 06/27/2021 Elsevier Patient Education  2024 Else[employer] Inc.  DASH Eating Plan DASH stands for Dietary Approaches to Stop Hypertension. The DASH eating plan is a healthy eating plan that has been shown to: Lower high blood pressure (hypertension). Reduce your risk for type 2 diabetes, heart disease, and stroke. Help with weight loss. What are tips for following this plan? Reading food labels Check food labels for the amount of salt (sodium) per serving. Choose foods with less than 5 percent of the Daily Value (DV) of sodium. In general, foods with less than 300 milligrams (mg) of sodium per serving fit into this eating plan. To find whole grains, look for the word "whole" as the first word in the ingredient list. Shopping Buy products labeled as "low-sodium" or "no salt added." Buy fresh foods. Avoid canned foods and pre-made or frozen meals. Cooking Try not to add salt when you cook. Use salt-free seasonings or herbs instead of table salt or sea salt. Check with your health care provider or pharmacist before using salt substitutes. Do not fry foods. Cook foods in healthy ways, such as baking, boiling, grilling, roasting, or broiling. Cook using oils that are good for your heart. These include olive, canola, avocado, soybean, and sunflower oil. Meal planning  Eat a balanced diet. This should include: 4 or more servings  of fruits and 4 or more servings of vegetables each day. Try to fill half of your plate with fruits and vegetables. 6-8 servings of whole grains each day. 6 or less servings of lean meat, poultry, or fish each day. 1 oz is 1 serving. A 3 oz (85 g) serving of meat is about the same size as the palm of your hand. One egg is 1 oz (28 g). 2-3 servings of low-fat dairy each day. One serving is 1 cup (237 mL). 1 serving of nuts, seeds, or beans 5 times each week. 2-3 servings of heart-healthy fats. Healthy fats called omega-3 fatty acids are found in foods such as walnuts, flaxseeds, fortified milks, and eggs. These fats are also found in cold-water fish, such as sardines, salmon, and mackerel. Limit how much you eat of: Canned or prepackaged foods. Food that is high in trans fat, such as fried foods. Food that is high in saturated fat, such as fatty meat. Desserts and other sweets, sugary drinks, and other foods with added sugar. Full-fat dairy products. Do not salt foods before eating. Do not eat more than 4 egg yolks a week. Try to eat at least 2  vegetarian meals a week. Eat more home-cooked food and less restaurant, buffet, and fast food. Lifestyle When eating at a restaurant, ask if your food can be made with less salt or no salt. If you drink alcohol: Limit how much you have to: 0-1 drink a day if you are female. 0-2 drinks a day if you are female. Know how much alcohol is in your drink. In the [place] one drink is one 12 oz bottle of beer (355 mL), one 5 oz glass of wine (148 mL), or one 1 oz glass of hard liquor (44 mL). General information Avoid eating more than 2,300 mg of salt a day. If you have hypertension, you may need to reduce your sodium intake to 1,500 mg a day. Work with your provider to stay at a healthy body weight or lose weight. Ask what the best weight range is for you. On most days of the week, get at least 30 minutes of exercise that causes your heart to beat faster. This  may include walking, swimming, or biking. Work with your provider or dietitian to adjust your eating plan to meet your specific calorie needs. What foods should I eat? Fruits All fresh, dried, or frozen fruit. Canned fruits that are in their natural juice and do not have sugar added to them. Vegetables Fresh or frozen vegetables that are raw, steamed, roasted, or grilled. Low-sodium or reduced-sodium tomato and vegetable juice. Low-sodium or reduced-sodium tomato sauce and tomato paste. Low-sodium or reduced-sodium canned vegetables. Grains Whole-grain or whole-wheat bread. Whole-grain or whole-wheat pasta. Brown rice. Dwyane Glad. Bulgur. Whole-grain and low-sodium cereals. Pita bread. Low-fat, low-sodium crackers. Whole-wheat flour tortillas. Meats and other proteins Skinless chicken or [place]. Ground chicken or [place]. Pork with fat trimmed off. Fish and seafood. Egg whites. Dried beans, peas, or lentils. Unsalted nuts, nut butters, and seeds. Unsalted canned beans. Lean cuts of beef with fat trimmed off. Low-sodium, lean precooked or cured meat, such as sausages or meat loaves. Dairy Low-fat (1%) or fat-free (skim) milk. Reduced-fat, low-fat, or fat-free cheeses. Nonfat, low-sodium ricotta or cottage cheese. Low-fat or nonfat yogurt. Low-fat, low-sodium cheese. Fats and oils Soft margarine without trans fats. Vegetable oil. Reduced-fat, low-fat, or light mayonnaise and salad dressings (reduced-sodium). Canola, safflower, olive, avocado, soybean, and sunflower oils. Avocado. Seasonings and condiments Herbs. Spices. Seasoning mixes without salt. Other foods Unsalted popcorn and pretzels. Fat-free sweets. The items listed above may not be all the foods and drinks you can have. Talk to a dietitian to learn more. What foods should I avoid? Fruits Canned fruit in a light or heavy syrup. Fried fruit. Fruit in cream or butter sauce. Vegetables Creamed or fried vegetables. Vegetables in a  cheese sauce. Regular canned vegetables that are not marked as low-sodium or reduced-sodium. Regular canned tomato sauce and paste that are not marked as low-sodium or reduced-sodium. Regular tomato and vegetable juices that are not marked as low-sodium or reduced-sodium. Vanessa General. Olives. Grains Baked goods made with fat, such as croissants, muffins, or some breads. Dry pasta or rice meal packs. Meats and other proteins Fatty cuts of meat. Ribs. Fried meat. Helene Loader. Bologna, salami, and other precooked or cured meats, such as sausages or meat loaves, that are not lean and low in sodium. Fat from the back of a pig (fatback). Bratwurst. Salted nuts and seeds. Canned beans with added salt. Canned or smoked fish. Whole eggs or egg yolks. Chicken or [place] with skin. Dairy Whole or 2% milk, cream, and half-and-half. Whole or full-fat  cream cheese. Whole-fat or sweetened yogurt. Full-fat cheese. Nondairy creamers. Whipped toppings. Processed cheese and cheese spreads. Fats and oils Butter. Stick margarine. Lard. Shortening. Ghee. Bacon fat. Tropical oils, such as coconut, palm kernel, or palm oil. Seasonings and condiments Onion salt, garlic salt, seasoned salt, table salt, and sea salt. Worcestershire sauce. Tartar sauce. Barbecue sauce. Teriyaki sauce. Soy sauce, including reduced-sodium soy sauce. Steak sauce. Canned and packaged gravies. Fish sauce. Oyster sauce. Cocktail sauce. Store-bought horseradish. Ketchup. Mustard. Meat flavorings and tenderizers. Bouillon cubes. Hot sauces. Pre-made or packaged marinades. Pre-made or packaged taco seasonings. Relishes. Regular salad dressings. Other foods Salted popcorn and pretzels. The items listed above may not be all the foods and drinks you should avoid. Talk to a dietitian to learn more. Where to find more information National Heart, Lung, and Blood Institute (NHLBI): BuffaloDryCleaner.gl American Heart Association (AHA): heart.org Academy of Nutrition and  Dietetics: eatright.org National Kidney Foundation (NKF): kidney.org This information is not intended to replace advice given to you by your health care provider. Make sure you discuss any questions you have with your health care provider. Document Revised: 10/30/2022 Document Reviewed: 10/30/2022 Elsevier Patient Education  2024 [employer].

## 2024-04-05 NOTE — Progress Notes (Signed)
 Acute Office Visit  Subjective:     Patient ID: Tamara Barajas, female    DOB: 1961-05-25, 63 y.o.   MRN: [id number]  Chief Complaint  Patient presents with   Dizziness   Hypertension    166/105 on yesterday when checked at work    Dizziness Pertinent negatives include no chest pain, chills, fever or headaches.  Hypertension Pertinent negatives include no chest pain, headaches, palpitations or shortness of breath.   Patient is in today for dizziness and high blood pressure reading.   Discussed the use of AI scribe software for clinical note transcription with the patient, who gave verbal consent to proceed.  History of Present Illness Oleva Koo is a 63 year old female with prediabetes and hyperlipidemia who presents with dizziness and elevated blood pressure.  Dizziness began yesterday, with a sensation of potentially passing out, especially when standing. It persisted throughout the day while working. Blood pressure recorded at 166/105 mmHg while standing. No cold-like symptoms, pain, or malaise.  Recent A1c is 6.3. Not taking prescribed metformin . LDL cholesterol stable at 829 mg/dL. Not taking Buspar  for anxiety. Takes meloxicam  for knee pain. Discontinued allergy medicine and ibuprofen .  Works in a [employer]. Recently visited her mother in [place] before symptom onset.    Review of Systems  Constitutional:  Negative for chills and fever.  Respiratory:  Negative for shortness of breath.   Cardiovascular:  Negative for chest pain, palpitations and leg swelling.  Neurological:  Positive for dizziness. Negative for headaches.        Objective:    BP (!) 148/82 (Cuff Size: Large)   Pulse (!) 59   Temp 97.9 F (36.6 C) (Oral)   Resp 16   Ht 5\' 5"  (1.651 m)   Wt 171 lb 1.6 oz (77.6 kg)   LMP 10/14/2012   SpO2 98%   BMI 28.47 kg/m  BP Readings from Last 3 Encounters:  04/05/24 (!) 148/82  12/01/23 126/78  07/28/23 122/74   Wt Readings from Last 3  Encounters:  04/05/24 171 lb 1.6 oz (77.6 kg)  12/01/23 168 lb 6.4 oz (76.4 kg)  07/28/23 167 lb 4.8 oz (75.9 kg)     Vitals:   04/05/24 1044 04/05/24 1109  BP: (!) 148/82 (!) 148/76     Physical Exam Constitutional:      Appearance: Normal appearance.  HENT:     Head: Normocephalic and atraumatic.  Eyes:     Conjunctiva/sclera: Conjunctivae normal.  Cardiovascular:     Rate and Rhythm: Normal rate and regular rhythm.  Pulmonary:     Effort: Pulmonary effort is normal.     Breath sounds: Normal breath sounds.  Musculoskeletal:     Right lower leg: No edema.     Left lower leg: No edema.  Skin:    General: Skin is warm and dry.  Neurological:     General: No focal deficit present.     Mental Status: She is alert. Mental status is at baseline.  Psychiatric:        Mood and Affect: Mood normal.        Behavior: Behavior normal.     No results found for any visits on 04/05/24.      Assessment & Plan:   Assessment & Plan Dizziness Acute dizziness with elevated BP 166/105 at home. Possible orthostatic hypotension or viral illness. Requires monitoring. - Monitor BP at home, especially mornings or lunchtime. - Avoid BP checks after work. - Ensure proper BP  measurement technique. - Provided information on hypertension and lifestyle modifications. - Reassess in August or sooner if symptoms persist. - Consider prescribing BP cuff if covered by insurance.  Prediabetes A1c increased to 6.3. Discussed diabetes prevention. Prescribed metformin . - Prescribe metformin  500 mg daily in the morning. - Monitor for side effects, consider extended-release if needed. - Recheck A1c at next visit.  Hyperlipidemia LDL at 110, consistent with previous. Continue current management. - Refill Crestor .  - metFORMIN  (GLUCOPHAGE ) 500 MG tablet; Take 1 tablet (500 mg total) by mouth daily with breakfast.  Dispense: 90 tablet; Refill: 1 - Blood Pressure Monitoring (BLOOD PRESSURE KIT)  KIT; 1 each by Does not apply route daily.  Dispense: 1 kit; Refill: 0   Return for already scheduled.  Rockney Cid, DO

## 2024-05-30 ENCOUNTER — Encounter: Payer: Self-pay | Admitting: Internal Medicine

## 2024-05-30 ENCOUNTER — Ambulatory Visit (INDEPENDENT_AMBULATORY_CARE_PROVIDER_SITE_OTHER): Payer: BC Managed Care – PPO | Admitting: Internal Medicine

## 2024-05-30 ENCOUNTER — Other Ambulatory Visit: Payer: Self-pay

## 2024-05-30 VITALS — BP 136/82 | HR 85 | Temp 98.2°F | Resp 16 | Ht 65.0 in | Wt 174.5 lb

## 2024-05-30 DIAGNOSIS — R030 Elevated blood-pressure reading, without diagnosis of hypertension: Secondary | ICD-10-CM | POA: Diagnosis not present

## 2024-05-30 DIAGNOSIS — F419 Anxiety disorder, unspecified: Secondary | ICD-10-CM | POA: Diagnosis not present

## 2024-05-30 DIAGNOSIS — R7303 Prediabetes: Secondary | ICD-10-CM

## 2024-05-30 LAB — POCT GLYCOSYLATED HEMOGLOBIN (HGB A1C): Hemoglobin A1C: 6.2 % — AB (ref 4.0–5.6)

## 2024-05-30 MED ORDER — BUSPIRONE HCL 5 MG PO TABS: 5.0000 mg | ORAL_TABLET | Freq: Two times a day (BID) | ORAL | 1 refills | Status: DC | PRN

## 2024-05-30 MED ORDER — HYDRALAZINE HCL 10 MG PO TABS: 10.0000 mg | ORAL_TABLET | Freq: Every day | ORAL | 1 refills | Status: AC | PRN

## 2024-05-30 NOTE — Patient Instructions (Signed)
Pneumococcal Conjugate Vaccine (PCV20) Injection What is this medication? PNEUMOCOCCAL CONJUGATE VACCINE (NEU mo KOK al kon ju gate vak SEEN) reduces the risk of pneumococcal disease, such as pneumonia. It does not treat pneumococcal disease. It is still possible to get pneumococcal disease after receiving this vaccine, but the symptoms may be less severe or not last as long. It works by helping your immune system learn how to fight off a future infection. This medicine may be used for other purposes; ask your health care provider or pharmacist if you have questions. COMMON BRAND NAME(S): Prevnar 20 What should I tell my care team before I take this medication? They need to know if you have any of these conditions: Bleeding disorder Fever Immune system problems An unusual or allergic reaction to pneumococcal vaccine, diphtheria toxoid, other vaccines, other medications, foods, dyes, or preservatives Pregnant or trying to get pregnant Breastfeeding How should I use this medication? This vaccine is injected into a muscle. It is given by your care team. A copy of Vaccine Information Statements will be given before each vaccination. Be sure to read this information carefully each time. This sheet may change often. Talk to your care team about the use of this medication in children. While it may be given to children as young as 6 weeks for selected conditions, precautions do apply. Overdosage: If you think you have taken too much of this medicine contact a poison control center or emergency room at once. NOTE: This medicine is only for you. Do not share this medicine with others. What if I miss a dose? This does not apply. This medication is not for regular use. What may interact with this medication? Medications for cancer chemotherapy Medications that suppress your immune function Steroid medications, such as prednisone or cortisone This list may not describe all possible interactions. Give  your health care provider a list of all the medicines, herbs, non-prescription drugs, or dietary supplements you use. Also tell them if you smoke, drink alcohol, or use illegal drugs. Some items may interact with your medicine. What should I watch for while using this medication? Visit your care team regularly. Report any side effects to your care team right away. This vaccine, like all vaccines, may not fully protect everyone. What side effects may I notice from receiving this medication? Side effects that you should report to your care team as soon as possible: Allergic reactions--skin rash, itching, hives, swelling of the face, lips, tongue, or throat Side effects that usually do not require medical attention (report these to your care team if they continue or are bothersome): Fatigue Fever Headache Joint pain Muscle pain Pain, redness, or irritation at injection site This list may not describe all possible side effects. Call your doctor for medical advice about side effects. You may report side effects to FDA at 1-800-FDA-1088. Where should I keep my medication? This vaccine is only given by your care team. It will not be stored at home. NOTE: This sheet is a summary. It may not cover all possible information. If you have questions about this medicine, talk to your doctor, pharmacist, or health care provider.  2024 Elsevier/Gold Standard (2022-03-26 00:00:00)
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
## 2024-08-30 ENCOUNTER — Other Ambulatory Visit: Payer: Self-pay | Admitting: Internal Medicine

## 2024-08-30 DIAGNOSIS — Z1231 Encounter for screening mammogram for malignant neoplasm of breast: Secondary | ICD-10-CM

## 2024-10-14 ENCOUNTER — Other Ambulatory Visit: Payer: Self-pay | Admitting: Internal Medicine

## 2024-10-14 ENCOUNTER — Ambulatory Visit: Admitting: Family Medicine

## 2024-10-14 ENCOUNTER — Other Ambulatory Visit (HOSPITAL_COMMUNITY)
Admission: RE | Admit: 2024-10-14 | Discharge: 2024-10-14 | Disposition: A | Discharge status: Home / Self Care | Source: Ambulatory Visit | Attending: Family Medicine | Admitting: Family Medicine

## 2024-10-14 ENCOUNTER — Telehealth: Payer: Self-pay | Admitting: Internal Medicine

## 2024-10-14 ENCOUNTER — Encounter: Payer: Self-pay | Admitting: Family Medicine

## 2024-10-14 VITALS — BP 132/76 | HR 78 | Resp 16 | Ht 65.0 in | Wt 170.0 lb

## 2024-10-14 DIAGNOSIS — Z113 Encounter for screening for infections with a predominantly sexual mode of transmission: Secondary | ICD-10-CM | POA: Diagnosis not present

## 2024-10-14 DIAGNOSIS — E782 Mixed hyperlipidemia: Secondary | ICD-10-CM

## 2024-10-14 DIAGNOSIS — R7303 Prediabetes: Secondary | ICD-10-CM

## 2024-10-14 DIAGNOSIS — Z202 Contact with and (suspected) exposure to infections with a predominantly sexual mode of transmission: Secondary | ICD-10-CM | POA: Diagnosis not present

## 2024-10-14 MED ORDER — METRONIDAZOLE 500 MG PO TABS: 500.0000 mg | ORAL_TABLET | Freq: Two times a day (BID) | ORAL | 0 refills | Status: AC

## 2024-10-14 NOTE — Telephone Encounter (Signed)
 Rosuvastatin  requested by pharmacy today in a separate refill encounter.

## 2024-10-14 NOTE — Telephone Encounter (Unsigned)
 Copied from CRM #[id number]. Topic: Clinical - Medication Refill >> Oct 14, 2024  4:53 PM Kevelyn M wrote: Medication: rosuvastatin  (CRESTOR ) 10 MG tablet, metFORMIN  (GLUCOPHAGE ) 500 MG tablet  Has the patient contacted their pharmacy? Yes (Agent: If no, request that the patient contact the pharmacy for the refill. If patient does not wish to contact the pharmacy document the reason why and proceed with request.) (Agent: If yes, when and what did the pharmacy advise?)  This is the patient's preferred pharmacy:  [facility] [address] ([address]), [place] - [address] [place]) [place] [address] Phone: [phone] Fax: [phone]  Is this the correct pharmacy for this prescription? Yes If no, delete pharmacy and type the correct one.   Has the prescription been filled recently? No  Is the patient out of the medication? No  Has the patient been seen for an appointment in the last year OR does the patient have an upcoming appointment? Yes  Can we respond through MyChart? No  Agent: Please be advised that Rx refills may take up to 3 business days. We ask that you follow-up with your pharmacy.

## 2024-10-14 NOTE — Telephone Encounter (Signed)
 Pt requesting a call once you receive her test results. Says her rhona is not working correctly.

## 2024-10-14 NOTE — Progress Notes (Signed)
 "  Acute Office Visit  Subjective:     Patient ID: Tamara Barajas, female    DOB: [birth year], 63 y.o.   MRN: [id number]  Chief Complaint  Patient presents with   Exposure to STD    Was told by partner they had trich so she wants to be tested    HPI Patient is in today for concerns of trichomonas exposure. She is a new patient to myself. She reports her partner sent her a text stating that they went for their yearly check up and tested positive for trichomonas last Friday. She reports she is sexually active with one partner and this is unprotected sexual intercourse. She reports last encounter was around two weeks ago. She denies symptoms including but not limited abnormal vaginal discharge, vaginal irritation or vaginal itching. She just desired testing due to suspected exposure.   Review of Systems  Genitourinary:  Negative for dysuria, frequency, hematuria and urgency.        Objective:    BP 132/76   Pulse 78   Resp 16   Ht 5' 5 (1.651 m)   Wt 170 lb (77.1 kg)   LMP 10/14/2012   SpO2 97%   BMI 28.29 kg/m    Physical Exam Constitutional:      Appearance: Normal appearance.  Cardiovascular:     Rate and Rhythm: Normal rate and regular rhythm.  Pulmonary:     Effort: Pulmonary effort is normal.     Breath sounds: Normal breath sounds.  Skin:    General: Skin is warm and dry.  Neurological:     General: No focal deficit present.     Mental Status: She is alert.  Psychiatric:        Mood and Affect: Mood normal.        Behavior: Behavior normal.      Assessment & Plan:   Assessment & Plan Exposure to trichomonas Patient is a 63 year old female who presents voicing concern of trichomonas exposure. She states that she is sexually active with one partner, and voices this partner sent her a message last Friday stating they tested positive for trichomonas at the doctor's appointment/ yearly check up. She states sexual intercourse is unprotected and the last time she  was sexually active is estimated to be two weeks ago. She denies vaginal discharge, vaginal itching or other irritation. She denies dysuria, hematuria, increased urinary frequency or urgency.   -Vaginal swab obtained during today's visit to test for trichomonas as well as gonorrhea and chlamydia.  -Due to partner testing positive and exposure will proceed with treatment.  -Metronidazole  500mg  PO BID x 7 days.  -Advised that she should refrain from sexual activity until she has completed treatment and other partner has completed treatment.   Orders:   Cervicovaginal ancillary only   metroNIDAZOLE  (FLAGYL ) 500 MG tablet; Take 1 tablet (500 mg total) by mouth 2 (two) times daily for 7 days.  Screening examination for STD (sexually transmitted disease) STD screening includes gonorrhea, chlamydia and trichomonas. Offered testing for other STDs including HIV and syphilis but patient declines.  Orders:   Cervicovaginal ancillary only      Meds ordered this encounter  Medications   metroNIDAZOLE  (FLAGYL ) 500 MG tablet    Sig: Take 1 tablet (500 mg total) by mouth 2 (two) times daily for 7 days.    Dispense:  14 tablet    Refill:  0    Return if symptoms worsen or fail to improve.  Bronwyn Belasco  N Naleyah Ohlinger, FNP   "

## 2024-10-14 NOTE — Telephone Encounter (Signed)
 Results will not be back until next week

## 2024-10-18 ENCOUNTER — Ambulatory Visit: Payer: Self-pay | Admitting: Family Medicine

## 2024-10-18 LAB — CERVICOVAGINAL ANCILLARY ONLY
Bacterial Vaginitis (gardnerella): POSITIVE — AB
Candida Glabrata: NEGATIVE
Candida Vaginitis: POSITIVE — AB
Chlamydia: NEGATIVE
Comment: NEGATIVE
Comment: NEGATIVE
Comment: NEGATIVE
Comment: NEGATIVE
Comment: NEGATIVE
Comment: NORMAL
Neisseria Gonorrhea: NEGATIVE
Trichomonas: POSITIVE — AB

## 2024-10-18 MED ORDER — METFORMIN HCL 500 MG PO TABS: 500.0000 mg | ORAL_TABLET | Freq: Every day | ORAL | 1 refills | Status: AC
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
## 2024-11-01 ENCOUNTER — Other Ambulatory Visit: Payer: Self-pay | Admitting: Internal Medicine

## 2024-11-01 DIAGNOSIS — E782 Mixed hyperlipidemia: Secondary | ICD-10-CM

## 2024-11-01 NOTE — Telephone Encounter (Signed)
 Copied from CRM [id number]. Topic: Clinical - Medication Refill >> Nov 01, 2024  2:40 PM Sophia H wrote: Medication: rosuvastatin  (CRESTOR ) 10 MG tablet   Has the patient contacted their pharmacy? Yes, told to contact office.   This is the patient's preferred pharmacy:  [facility] [address] ([address]), [place] - [address] [place]) [place] [address] Phone: [phone] Fax: [phone]  Is this the correct pharmacy for this prescription? Yes If no, delete pharmacy and type the correct one.   Has the prescription been filled recently? Yes  Is the patient out of the medication? Yes, completely out.  Has the patient been seen for an appointment in the last year OR does the patient have an upcoming appointment? Yes, has an appt Feb 5   Can we respond through MyChart? Yes  Agent: Please be advised that Rx refills may take up to 3 business days. We ask that you follow-up with your pharmacy.

## 2024-11-03 NOTE — Telephone Encounter (Signed)
 Called pharmacy 4 times - no answer. Will forward to clinic.

## 2024-11-03 NOTE — Telephone Encounter (Signed)
 Called pharmacy no answer
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
## 2024-12-01 ENCOUNTER — Ambulatory Visit
Admission: RE | Admit: 2024-12-01 | Discharge: 2024-12-01 | Disposition: A | Source: Home / Self Care | Attending: Internal Medicine | Admitting: Internal Medicine

## 2024-12-01 ENCOUNTER — Ambulatory Visit: Admitting: Internal Medicine

## 2024-12-01 ENCOUNTER — Other Ambulatory Visit: Payer: Self-pay

## 2024-12-01 ENCOUNTER — Encounter: Payer: Self-pay | Admitting: Internal Medicine

## 2024-12-01 ENCOUNTER — Ambulatory Visit
Admission: RE | Admit: 2024-12-01 | Discharge: 2024-12-01 | Disposition: A | Source: Ambulatory Visit | Attending: Internal Medicine

## 2024-12-01 VITALS — BP 136/72 | HR 81 | Resp 18 | Ht 68.0 in | Wt 167.8 lb

## 2024-12-01 DIAGNOSIS — F419 Anxiety disorder, unspecified: Secondary | ICD-10-CM

## 2024-12-01 DIAGNOSIS — R1032 Left lower quadrant pain: Secondary | ICD-10-CM | POA: Diagnosis not present

## 2024-12-01 DIAGNOSIS — E782 Mixed hyperlipidemia: Secondary | ICD-10-CM | POA: Diagnosis not present

## 2024-12-01 DIAGNOSIS — R030 Elevated blood-pressure reading, without diagnosis of hypertension: Secondary | ICD-10-CM

## 2024-12-01 DIAGNOSIS — R7303 Prediabetes: Secondary | ICD-10-CM | POA: Diagnosis not present

## 2024-12-01 LAB — POCT GLYCOSYLATED HEMOGLOBIN (HGB A1C): Hemoglobin A1C: 6.1 % — AB (ref 4.0–5.6)

## 2024-12-01 MED ORDER — BUSPIRONE HCL 10 MG PO TABS: 10.0000 mg | ORAL_TABLET | Freq: Every day | ORAL | 1 refills | Status: AC

## 2024-12-01 MED ORDER — ROSUVASTATIN CALCIUM 10 MG PO TABS: 10.0000 mg | ORAL_TABLET | Freq: Every day | ORAL | 1 refills | Status: AC

## 2024-12-01 MED ORDER — NAPROXEN 500 MG PO TABS: 500.0000 mg | ORAL_TABLET | Freq: Two times a day (BID) | ORAL | 0 refills | Status: AC
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
## 2025-01-16 ENCOUNTER — Encounter

## 2025-06-01 ENCOUNTER — Ambulatory Visit: Admitting: Internal Medicine
